# Patient Record
Sex: Female | Born: 1980 | Race: White | Hispanic: No | Marital: Married | Smoking: Current every day smoker
Health system: Southern US, Community
[De-identification: ages and names within clinical notes are randomized; demographics above are authoritative.]

## PROBLEM LIST (undated history)

## (undated) DIAGNOSIS — K589 Irritable bowel syndrome without diarrhea: Secondary | ICD-10-CM

## (undated) DIAGNOSIS — U099 Post covid-19 condition, unspecified: Secondary | ICD-10-CM

## (undated) DIAGNOSIS — F4310 Post-traumatic stress disorder, unspecified: Secondary | ICD-10-CM

## (undated) DIAGNOSIS — G2581 Restless legs syndrome: Secondary | ICD-10-CM

## (undated) DIAGNOSIS — R5383 Other fatigue: Secondary | ICD-10-CM

## (undated) DIAGNOSIS — F32A Depression, unspecified: Secondary | ICD-10-CM

## (undated) DIAGNOSIS — K219 Gastro-esophageal reflux disease without esophagitis: Secondary | ICD-10-CM

## (undated) DIAGNOSIS — F329 Major depressive disorder, single episode, unspecified: Secondary | ICD-10-CM

## (undated) DIAGNOSIS — F419 Anxiety disorder, unspecified: Secondary | ICD-10-CM

## (undated) DIAGNOSIS — G43909 Migraine, unspecified, not intractable, without status migrainosus: Secondary | ICD-10-CM

## (undated) DIAGNOSIS — G4700 Insomnia, unspecified: Secondary | ICD-10-CM

## (undated) HISTORY — DX: Insomnia, unspecified: G47.00

## (undated) HISTORY — DX: Post-traumatic stress disorder, unspecified: F43.10

## (undated) HISTORY — PX: OTHER SURGICAL HISTORY: SHX169

## (undated) HISTORY — DX: Migraine, unspecified, not intractable, without status migrainosus: G43.909

## (undated) HISTORY — PX: CHOLECYSTECTOMY, LAPAROSCOPIC: SHX56

## (undated) HISTORY — DX: Irritable bowel syndrome, unspecified: K58.9

## (undated) HISTORY — DX: Post covid-19 condition, unspecified: U09.9

## (undated) HISTORY — DX: Other fatigue: R53.83

## (undated) HISTORY — DX: Anxiety disorder, unspecified: F41.9

## (undated) HISTORY — DX: Gastro-esophageal reflux disease without esophagitis: K21.9

## (undated) HISTORY — DX: Restless legs syndrome: G25.81

---

## 2011-05-06 ENCOUNTER — Inpatient Hospital Stay (INDEPENDENT_AMBULATORY_CARE_PROVIDER_SITE_OTHER)
Admission: RE | Admit: 2011-05-06 | Discharge: 2011-05-06 | Disposition: A | Discharge status: Home / Self Care | Payer: Managed Care, Other (non HMO) | Source: Ambulatory Visit | Attending: Emergency Medicine | Admitting: Emergency Medicine

## 2011-05-06 ENCOUNTER — Encounter: Payer: Self-pay | Admitting: Emergency Medicine

## 2011-05-06 DIAGNOSIS — J020 Streptococcal pharyngitis: Secondary | ICD-10-CM

## 2011-05-06 LAB — CONVERTED CEMR LAB: Rapid Strep: NEGATIVE

## 2011-06-24 NOTE — Progress Notes (Signed)
Summary: POSS STREP THROAT...WSE rm 4   Vital Signs:  Patient Profile:   30 Years Old Female CC:      sore throat, fever, and white spots on throat x 2 days Height:     64 inches Weight:      161.25 pounds O2 Sat:      100 % O2 treatment:    Room Air Temp:     98.5 degrees F oral Pulse rate:   87 / minute Resp:     16 per minute BP sitting:   119 / 76  (left arm) Cuff size:   regular  Vitals Entered By: Clemens Catholic LPN (May 06, 2011 5:54 PM)                  Updated Prior Medication List: CITALOPRAM HYDROBROMIDE 20 MG TABS (CITALOPRAM HYDROBROMIDE)   Current Allergies: ! MACROBID ! SULFAHistory of Present Illness Chief Complaint: sore throat, fever, and white spots on throat x 2 days History of Present Illness: 30 Years Old Female complains of onset of cold symptoms for a few days.   + sore throat and white spots No cough No pleuritic pain No wheezing No nasal congestion No post-nasal drainage No sinus pain/pressure No chest congestion No itchy/red eyes No earache No hemoptysis No SOB No chills/sweats + fever No nausea No vomiting No abdominal pain No diarrhea No skin rashes No fatigue No myalgias No headache   REVIEW OF SYSTEMS Constitutional Symptoms       Complains of fever, chills, and night sweats.     Denies weight loss, weight gain, and fatigue.  Eyes       Denies change in vision, eye pain, eye discharge, glasses, contact lenses, and eye surgery. Ear/Nose/Throat/Mouth       Complains of frequent runny nose, sinus problems, and sore throat.      Denies hearing loss/aids, change in hearing, ear pain, ear discharge, dizziness, frequent nose bleeds, hoarseness, and tooth pain or bleeding.  Respiratory       Complains of dry cough.      Denies productive cough, wheezing, shortness of breath, asthma, bronchitis, and emphysema/COPD.  Cardiovascular       Denies murmurs, chest pain, and tires easily with exhertion.    Gastrointestinal  Denies stomach pain, nausea/vomiting, diarrhea, constipation, blood in bowel movements, and indigestion. Genitourniary       Denies painful urination, kidney stones, and loss of urinary control. Neurological       Denies paralysis, seizures, and fainting/blackouts. Musculoskeletal       Denies muscle pain, joint pain, joint stiffness, decreased range of motion, redness, swelling, muscle weakness, and gout.  Skin       Denies bruising, unusual mles/lumps or sores, and hair/skin or nail changes.  Psych       Denies mood changes, temper/anger issues, anxiety/stress, speech problems, depression, and sleep problems. Other Comments: pt c/o sore throat, fever, and white spots on throat x 2 days.    Past History:  Past Medical History: Unremarkable  Past Surgical History: double fisulaplasty  Family History: father-[place]  Social History: Current Smoker 1/2 PPD Alcohol use-no  Drug use-no Smoking Status:  current Drug Use:  no Physical Exam General appearance: well developed, well nourished, no acute distress Ears: normal, no lesions or deformities Nasal: mucosa pink, nonedematous, no septal deviation, turbinates normal Oral/Pharynx: pharyngeal erythema with exudate, uvula midline without deviation Neck: ant cerv LAD Chest/Lungs: no rales, wheezes, or rhonchi bilateral, breath sounds equal without  effort Heart: regular rate and  rhythm, no murmur MSE: oriented to time, place, and person Assessment New Problems: STREPTOCOCCAL SORE THROAT (ICD-034.0)   Plan New Orders: New Patient Level II [99202] Bicillin CR 1.2 million units Injection [J0558] Admin of Therapeutic Inj  intramuscular or subcutaneous [96372] Planning Comments:   1)  Rapid strep negative, however with her physical exam I believe that she requires treatment.  Bicillin given today.  Change toothbrush in 1-2 days.  No sharing cups/utensils.  If not improving, consider pred and checking Monospot. 2)  Use nasal saline  solution (over the counter) at least 3 times a day. 3)  Use over the counter decongestants like Zyrtec-D every 12 hours as needed to help with congestion. 4)  Can take tylenol every 6 hours or motrin every 8 hours for pain or fever. 5)  Follow up with your primary doctor  if no improvement in 5-7 days, sooner if increasing pain, fever, or new symptoms.    The patient and/or caregiver has been counseled thoroughly with regard to medications prescribed including dosage, schedule, interactions, rationale for use, and possible side effects and they verbalize understanding.  Diagnoses and expected course of recovery discussed and will return if not improved as expected or if the condition worsens. Patient and/or caregiver verbalized understanding.   Medication Administration  Injection # 1:    Medication: Bicillin CR 1.2 million units Injection    Diagnosis: STREPTOCOCCAL SORE THROAT (ICD-034.0)    Route: IM    Site: LUOQ gluteus    Exp Date: 06/21/2012    Lot #: [id number]    Mfr: [facility].    Patient tolerated injection without complications    Given by: Clemens Catholic LPN (May 06, 2011 6:10 PM)  Orders Added: 1)  New Patient Level II [99202] 2)  Bicillin CR 1.2 million units Injection [J0558] 3)  Admin of Therapeutic Inj  intramuscular or subcutaneous [96372]    Laboratory Results  Date/Time Received: May 06, 2011 5:58 PM  Date/Time Reported: May 06, 2011 5:58 PM   Other Tests  Rapid Strep: negative  Kit Test Internal QC: Negative   (Normal Range: Negative)

## 2011-10-13 ENCOUNTER — Emergency Department
Admission: EM | Admit: 2011-10-13 | Discharge: 2011-10-13 | Disposition: A | Discharge status: Home / Self Care | Payer: Managed Care, Other (non HMO) | Source: Home / Self Care | Attending: Emergency Medicine | Admitting: Emergency Medicine

## 2011-10-13 DIAGNOSIS — J069 Acute upper respiratory infection, unspecified: Secondary | ICD-10-CM

## 2011-10-13 DIAGNOSIS — J029 Acute pharyngitis, unspecified: Secondary | ICD-10-CM

## 2011-10-13 HISTORY — DX: Major depressive disorder, single episode, unspecified: F32.9

## 2011-10-13 HISTORY — DX: Depression, unspecified: F32.A

## 2011-10-13 MED ORDER — PENICILLIN G BENZATHINE 1200000 UNIT/2ML IM SUSP
1.2000 10*6.[IU] | Freq: Once | INTRAMUSCULAR | Status: AC
  Administered 2011-10-13: 1.2 10*6.[IU] via INTRAMUSCULAR

## 2011-10-13 NOTE — ED Notes (Signed)
Sore throat started yesterday, T-Max 102.0 this am has taken advil

## 2011-10-13 NOTE — ED Provider Notes (Signed)
History     CSN: [id number]  Arrival date & time 10/13/11  1132   First MD Initiated Contact with Patient 10/13/11 1134      Chief Complaint  Patient presents with  . Sore Throat    (Consider location/radiation/quality/duration/timing/severity/associated sxs/prior treatment) HPI Isabella Martin is a 31 y.o. female who complains of onset of cold symptoms for a few days.  The patient states it is constant moderate in severity.  She had strep throat 6 months ago successfully treated with Bicillin. +sore throat No cough No pleuritic pain No wheezing No nasal congestion No post-nasal drainage No sinus pain/pressure No chest congestion No itchy/red eyes No earache No hemoptysis No SOB + chills/sweats + fever No nausea No vomiting No abdominal pain No diarrhea No skin rashes No fatigue No myalgias No headache    No past medical history on file.  No past surgical history on file.  No family history on file.  History  Substance Use Topics  . Smoking status: Not on file  . Smokeless tobacco: Not on file  . Alcohol Use: Not on file    OB History    No data available      Review of Systems  All other systems reviewed and are negative.    Allergies  Nitrofurantoin and Sulfonamide derivatives  Home Medications  No current outpatient prescriptions on file.  BP 115/77  Pulse 104  Temp(Src) 99.3 F (37.4 C) (Oral)  Resp 20  Ht 5\' 4"  (1.626 m)  Wt 163 lb 8 oz (74.163 kg)  BMI 28.06 kg/m2  SpO2 96%  Physical Exam  Nursing note and vitals reviewed. Constitutional: She is oriented to person, place, and time. She appears well-developed and well-nourished.  HENT:  Head: Normocephalic and atraumatic.  Right Ear: Tympanic membrane, external ear and ear canal normal.  Left Ear: Tympanic membrane, external ear and ear canal normal.  Nose: Nose normal.  Mouth/Throat: Oropharyngeal exudate, posterior oropharyngeal edema (bilateral 1+ tonsillar swelling) and posterior  oropharyngeal erythema present.  Eyes: No scleral icterus.  Neck: Neck supple.  Cardiovascular: Regular rhythm and normal heart sounds.   Pulmonary/Chest: Effort normal and breath sounds normal. No respiratory distress.  Neurological: She is alert and oriented to person, place, and time.  Skin: Skin is warm and dry.  Psychiatric: She has a normal mood and affect. Her speech is normal.    ED Course  Procedures (including critical care time)  Labs Reviewed - No data to display No results found.   1. Acute pharyngitis   2. Acute upper respiratory infections of unspecified site       MDM  1)  Rapid strep positive.  Bicillin given.  Advised to change toothbrush. Work note given for tomorrow. 2)  Use nasal saline solution (over the counter) at least 3 times a day. 3)  Can take tylenol every 6 hours and/or motrin every 8 hours for pain or fever. 5)  Follow up with your primary doctor if no improvement in 5-7 days, sooner if increasing pain, fever, or new symptoms.  If becoming recurrent, consider follow up with ENT.   Marlaine Hind, MD 10/13/11 1153

## 2012-10-28 ENCOUNTER — Encounter: Payer: Self-pay | Admitting: Emergency Medicine

## 2012-10-28 ENCOUNTER — Emergency Department
Admission: EM | Admit: 2012-10-28 | Discharge: 2012-10-28 | Disposition: A | Discharge status: Home / Self Care | Payer: Managed Care, Other (non HMO) | Source: Home / Self Care | Attending: Family Medicine | Admitting: Family Medicine

## 2012-10-28 DIAGNOSIS — J029 Acute pharyngitis, unspecified: Secondary | ICD-10-CM

## 2012-10-28 LAB — POCT RAPID STREP A (OFFICE): Rapid Strep A Screen: POSITIVE — AB

## 2012-10-28 MED ORDER — PENICILLIN V POTASSIUM 500 MG PO TABS: 500.0000 mg | ORAL_TABLET | Freq: Three times a day (TID) | ORAL | Status: DC

## 2012-10-28 NOTE — ED Provider Notes (Signed)
History     CSN: [id number]  Arrival date & time 10/28/12  1055   First MD Initiated Contact with Patient 10/28/12 1110      Chief Complaint  Patient presents with  . Sore Throat   HPI  SORE THROAT  Onset: 2 days  Description: sore throat Modifying factors: hx/o recurrent strep   Symptoms  Fever:  mild URI symptoms: no Cough: no Headache: no Rash:  no Swollen glands:   yes Recent Strep Exposure: no LUQ pain: no Heartburn/brash: no Allergy Symptoms: no  Red Flags STD exposure: no Breathing difficulty: no Drooling: no Trismus: no   Past Medical History  Diagnosis Date  . Depression     Past Surgical History  Procedure Laterality Date  . Double syicter plasty      Family History  Problem Relation Age of Onset  . Cancer Father     History  Substance Use Topics  . Smoking status: Current Every Day Smoker  . Smokeless tobacco: Not on file  . Alcohol Use: No    OB History   Grav Para Term Preterm Abortions TAB SAB Ect Mult Living                  Review of Systems  All other systems reviewed and are negative.    Allergies  Nitrofurantoin and Sulfonamide derivatives  Home Medications   Current Outpatient Rx  Name  Route  Sig  Dispense  Refill  . citalopram (CELEXA) 10 MG tablet   Oral   Take 10 mg by mouth daily.           BP 111/76  Pulse 115  Temp(Src) 98.7 F (37.1 C) (Oral)  Resp 16  Ht 5\' 7"  (1.702 m)  Wt 157 lb (71.215 kg)  BMI 24.58 kg/m2  SpO2 99%  LMP 10/05/2012  Physical Exam  Constitutional: She appears well-developed and well-nourished.  HENT:  Head: Normocephalic and atraumatic.  Mouth/Throat: Oropharyngeal exudate present.  Eyes: Conjunctivae are normal. Pupils are equal, round, and reactive to light.  Neck: Normal range of motion. Neck supple.  Cardiovascular: Normal rate, regular rhythm and normal heart sounds.   Pulmonary/Chest: Effort normal.  Lymphadenopathy:    She has cervical adenopathy.   Neurological: She is alert.  Skin: Skin is warm.    ED Course  Procedures (including critical care time)  Labs Reviewed  POCT RAPID STREP A (OFFICE) - Abnormal; Notable for the following:    Rapid Strep A Screen Positive (*)    All other components within normal limits   No results found.   1. Acute pharyngitis       MDM  Will treat with pen vk x 10 days. Follow up with ENT as pt would benefit from tonsillectomy.  General and ENT red flags reviewed.  Followed up as needed.     The patient and/or caregiver has been counseled thoroughly with regard to treatment plan and/or medications prescribed including dosage, schedule, interactions, rationale for use, and possible side effects and they verbalize understanding. Diagnoses and expected course of recovery discussed and will return if not improved as expected or if the condition worsens. Patient and/or caregiver verbalized understanding.             Doree Albee, MD 10/28/12 1124

## 2012-10-28 NOTE — ED Notes (Signed)
Sore throat started yesterday

## 2016-03-26 ENCOUNTER — Emergency Department
Admission: EM | Admit: 2016-03-26 | Discharge: 2016-03-26 | Disposition: A | Discharge status: Home / Self Care | Payer: Managed Care, Other (non HMO) | Source: Home / Self Care | Attending: Family Medicine | Admitting: Family Medicine

## 2016-03-26 DIAGNOSIS — Z8719 Personal history of other diseases of the digestive system: Secondary | ICD-10-CM

## 2016-03-26 DIAGNOSIS — K2970 Gastritis, unspecified, without bleeding: Secondary | ICD-10-CM

## 2016-03-26 DIAGNOSIS — R1010 Upper abdominal pain, unspecified: Secondary | ICD-10-CM

## 2016-03-26 MED ORDER — OMEPRAZOLE 20 MG PO CPDR: 40.0000 mg | DELAYED_RELEASE_CAPSULE | Freq: Every day | ORAL | 0 refills | Status: DC

## 2016-03-26 NOTE — ED Notes (Signed)
97/61 BP entered in error

## 2016-03-26 NOTE — ED Provider Notes (Signed)
CSN: [id number]     Arrival date & time 03/26/16  1755 History   First MD Initiated Contact with Patient 03/26/16 1832     Chief Complaint  Patient presents with  . Abdominal Pain   (Consider location/radiation/quality/duration/timing/severity/associated sxs/prior Treatment) HPI  Isabella Martin is a 35 y.o. female presenting to UC with c/o gradually worsening upper abdominal pain that is aching and burning with associated bloated feeling for over 1 month.  Symptoms occur most often when eating any food or even drinking water. She has hx of GERD but states "that burning was in my throat" and she has not had prescription omeprazole in several years. She has tried OTC Pepto-bismol and Tums w/o relief.  Associated nausea but no vomiting.  She cannot f/u with her PCP for at least 5 weeks. She is requesting a referral to GI.   Past Medical History:  Diagnosis Date  . Depression    Past Surgical History:  Procedure Laterality Date  . Double Hotel manager     Family History  Problem Relation Age of Onset  . Cancer Father    Social History  Substance Use Topics  . Smoking status: Current Every Day Smoker  . Smokeless tobacco: Not on file  . Alcohol use No   OB History    No data available     Review of Systems  Constitutional: Negative for appetite change, chills and fever.  Respiratory: Negative for cough and shortness of breath.   Cardiovascular: Negative for chest pain and palpitations.  Gastrointestinal: Positive for abdominal pain and nausea. Negative for diarrhea and vomiting.  Genitourinary: Negative for dysuria, frequency and hematuria.    Allergies  Nitrofurantoin and Sulfonamide derivatives  Home Medications   Prior to Admission medications   Medication Sig Start Date End Date Taking? Authorizing Provider  omeprazole (PRILOSEC) 20 MG capsule Take 2 capsules (40 mg total) by mouth daily. For 4 weeks 03/26/16   Junius Finner, PA-C  penicillin v potassium (VEETID) 500 MG  tablet Take 1 tablet (500 mg total) by mouth 3 (three) times daily. 10/28/12   Floydene Flock, MD   Meds Ordered and Administered this Visit  Medications - No data to display  BP 97/61   Pulse 72   Temp 98 F (36.7 C) (Oral)   Ht 5\' 4"  (1.626 m)   Wt 169 lb 6.4 oz (76.8 kg)   LMP 02/26/2016   SpO2 100%   BMI 29.08 kg/m  No data found.   Physical Exam  Constitutional: She appears well-developed and well-nourished. No distress.  HENT:  Head: Normocephalic and atraumatic.  Mouth/Throat: Oropharynx is clear and moist.  Eyes: Conjunctivae are normal. No scleral icterus.  Neck: Normal range of motion.  Cardiovascular: Normal rate, regular rhythm and normal heart sounds.   Pulmonary/Chest: Effort normal and breath sounds normal. No respiratory distress. She has no wheezes. She has no rales.  Abdominal: Soft. She exhibits no distension and no mass. There is tenderness. There is no rebound and no guarding.  Soft, non-distended. Tenderness to upper abdomen w/o rebound or guarding.   Musculoskeletal: Normal range of motion.  Neurological: She is alert.  Skin: Skin is warm and dry. She is not diaphoretic.  Nursing note and vitals reviewed.   Urgent Care Course   Clinical Course    Procedures (including critical care time)  Labs Review Labs Reviewed - No data to display  Imaging Review No results found.   MDM   1. Gastritis   2.  Upper abdominal pain   3. History of gastroesophageal reflux (GERD)    Pt with hx of GERD c/o LUQ abdominal pain that is worse when eating, associated nausea.    Pt appears well non-toxic. Hx and exam c/w gastritis, pt may have gastric ulcer. Advised referrals to GI are not made by UC, however, offered a resource guide and encouraged pt to call GI office as they may not require a referral.  Also provided resources for other PCPs if she needs to be seen sooner than 5 weeks.  Rx: omeprazole 40mg  for 4 weeks.  Home care instructions with diet  provided. Patient verbalized understanding and agreement with treatment plan.     Junius FinnerErin O'Malley, PA-C 03/27/16 [phone]

## 2016-03-26 NOTE — ED Triage Notes (Signed)
Pt stated that this issue has been going on for over a month.  Has has nausea, stomach aches, diarrhea, gas and bloating for over a month.  She states that it happens with any food, and no medication helps relieve the pain.

## 2018-04-27 ENCOUNTER — Other Ambulatory Visit: Payer: Self-pay

## 2018-04-27 MED ORDER — DULOXETINE HCL 60 MG PO CPEP: 60.0000 mg | ORAL_CAPSULE | ORAL | 0 refills | Status: DC

## 2018-05-01 ENCOUNTER — Ambulatory Visit: Payer: BLUE CROSS/BLUE SHIELD | Admitting: Psychiatry

## 2018-05-01 DIAGNOSIS — F4310 Post-traumatic stress disorder, unspecified: Secondary | ICD-10-CM | POA: Diagnosis not present

## 2018-05-01 NOTE — Progress Notes (Signed)
      [facility] Counselor/Therapist Progress Note   Patient ID: Isabella Martin, MRN: [id number]  Date: 05/01/2018  Timespent: 60 minutes  Treatment Type: Individual   Subjective: Patient in today with some depression and anxiety (although not quite as strong "since I'm going on vacation tomorrow).  Discussed some painful parts of her past and how she is affected now.  Feels that it's her self-esteem and her thoughts that are most affected by her past and that these are the things she really needs to work on.  Strategies worked on to improve self-esteem, negative self-talk, dealing with old memories, excessive people-pleasing, not setting healthy boundaries, overthinking, feeling responsible for others' happiness, sense of shame or not good enough, and excessive worrying and looking for what may go wrong versus right.  Patient very engaged in working on strategies and reports feeling more hopeful for changes she wants to make.  Interventions:CBT, Solution Focused, Strength-based, Supportive and Reframing  Mental Status Exam:   Appearance:   Neat     Behavior:  Appropriate and Sharing  Motor:  Normal  Speech/Language:   Normal Rate  Affect:  Congruent  Mood:  anxious and depressed  Thought process:  Coherent  Thought content:    Logical  Perceptual disturbances:    Normal  Orientation:  Full (Time, Place, and Person)  Attention:  Good  Concentration:  good  Memory:  Immediate  Fund of knowledge:   Good  Insight:    Good  Judgment:   Good  Impulse Control:  good    Reported Symptoms: some anxiety and depression---not as strong in the moment "as I'm fixing to be off on vacation and am looking forward to that".   Risk Assessment: Danger to Self:  No Self-injurious Behavior: No Danger to Others: No Duty to Warn:no Physical Aggression / Violence:No  Access to Firearms a concern: No  Gang Involvement:No   Diagnosis:   ICD-10-CM   1. Posttraumatic stress disorder F43.10       Plan: Will see patient again in 2 weeks to continue goal-directed treatment.    Mathis Fare, LCSW

## 2018-05-18 ENCOUNTER — Encounter: Payer: Self-pay | Admitting: Emergency Medicine

## 2018-05-18 DIAGNOSIS — G4700 Insomnia, unspecified: Secondary | ICD-10-CM

## 2018-05-18 DIAGNOSIS — F329 Major depressive disorder, single episode, unspecified: Secondary | ICD-10-CM | POA: Insufficient documentation

## 2018-05-18 DIAGNOSIS — F411 Generalized anxiety disorder: Secondary | ICD-10-CM

## 2018-05-20 ENCOUNTER — Ambulatory Visit: Payer: BLUE CROSS/BLUE SHIELD | Admitting: Psychiatry

## 2018-05-20 DIAGNOSIS — F4310 Post-traumatic stress disorder, unspecified: Secondary | ICD-10-CM

## 2018-05-20 NOTE — Progress Notes (Signed)
[facility] Counselor/Therapist Progress Note   Patient ID: Isabella Martin, MRN: [id number]  Date: 05/20/2018  Timespent: 60 minutes  Treatment Type: Individual   Reported Symptoms: just feel I'm existing, anxiety, depressed and feeling blue--it comes and goes, some days I'm great and next day I'm not good, lack of motivation)     Mental Status Exam:    Appearance:   Neat     Behavior:  Appropriate and Sharing  Motor:  Normal  Speech/Language:   Clear and Coherent  Affect:  Flat  Mood:  anxious and depressed  Thought process:  normal  Thought content:    WNL  Sensory/Perceptual disturbances:    WNL  Orientation:  oriented to person, place, time/date, situation, day of week, month of year and year  Attention:  Good  Concentration:  Good  Memory:  WNL  Fund of knowledge:   Good  Insight:    Good  Judgment:   Good  Impulse Control:  Good     Risk Assessment: Danger to Self:  No Self-injurious Behavior: No Danger to Others: No Duty to Warn:no Physical Aggression / Violence:No  Access to Firearms a concern: No  Gang Involvement:No    Subjective: Patient in today reporting depression and anxiety, feeling "blue" and not feeling any happiness, low motivation.  Did have a good trip last week with friends and did experience "joy" during those few days "although I still had to force myself to get up and be involved.  Questions if she needs a stimulant to "help me feel more alive?"  Was reportedly "scattered" at school over the years.  Does have some attention and focus issues at work, tending to jump from one incomplete task to another. States husband has notice patient trying to be more positive, especially in her self-talk.  "I feel that I ma trying to be more self-accepting and self-assured, and not beating myself up over things I can't control".    Noticed that patient rarely smiles and affect is rather consistently flat, so asked her about this.  She then unloaded about how  impactful her dad's cancer dx was in Dec 03, 1998, and found it very difficult to deal with, but never got help except PCP put her on antidepressants.  Felt like the lexapro helped some but "I still was very depressed about my dad".  Dad died in 03-Dec-2007.  "I held my emotions in as I didn't want to harm my unborn baby at that time."  I tried to be "ok" but it has been very hard.  Talked at length about this today, opening up a lot more, and she seemed comfortable with this. "Previously I've felt very alone with this because of not really having someone there for me."  "I don't want the past to affect me so much, I want to be happy, mom is still negative some and is sort of selfish and doesn't act like I'm important to her."  I also need to not let negative people in my life have so much power as to how I think or feel."  Really did a good job in talking things through more today.  Actually smiled a couple times closer to end of session. Talked about continuing therapy and what our focus needs to be in following up on discussion today in order to help patient really move forward.  Reports feeling heard and more motivated at this point.  Will see patent within 2 weeks.   Interventions: Solution-Oriented/Positive Psychology, Ego-Supportive  and Insight-Oriented   Diagnosis:   ICD-10-CM   1. Posttraumatic stress disorder F43.10      Plan:  Patient to return in approx 2 weeks to continue goal-directed treatment.   Mathis Fare, LCSW

## 2018-05-21 ENCOUNTER — Other Ambulatory Visit: Payer: Self-pay | Admitting: Physician Assistant

## 2018-05-21 MED ORDER — ESZOPICLONE 3 MG PO TABS: 3.0000 mg | ORAL_TABLET | Freq: Every day | ORAL | 0 refills | Status: DC

## 2018-06-01 ENCOUNTER — Ambulatory Visit: Payer: BLUE CROSS/BLUE SHIELD | Admitting: Physician Assistant

## 2018-06-01 ENCOUNTER — Encounter: Payer: Self-pay | Admitting: Physician Assistant

## 2018-06-01 DIAGNOSIS — G2581 Restless legs syndrome: Secondary | ICD-10-CM | POA: Diagnosis not present

## 2018-06-01 DIAGNOSIS — F411 Generalized anxiety disorder: Secondary | ICD-10-CM

## 2018-06-01 DIAGNOSIS — F331 Major depressive disorder, recurrent, moderate: Secondary | ICD-10-CM

## 2018-06-01 MED ORDER — ALPRAZOLAM 1 MG PO TABS: ORAL_TABLET | ORAL | 2 refills | Status: DC

## 2018-06-01 MED ORDER — ATOMOXETINE HCL 40 MG PO CAPS: 40.0000 mg | ORAL_CAPSULE | Freq: Every day | ORAL | 1 refills | Status: DC

## 2018-06-01 MED ORDER — ZOLPIDEM TARTRATE 10 MG PO TABS: 10.0000 mg | ORAL_TABLET | Freq: Every evening | ORAL | 2 refills | Status: DC | PRN

## 2018-06-01 NOTE — Progress Notes (Signed)
[facility]  Patient ID: Isabella Martin,  MRN: [id number]  PCP: System, Provider Not In  Date of Evaluation: 06/01/2018 Time spent:15 minutes  Chief Complaint:  Chief Complaint    Follow-up      HISTORY/CURRENT STATUS: HPI For routine f/u.   We had started Lunesta at the [facility]. It's caused a H/A everytime she's taken it.  Also it causes a horrible taste in her mouth.  Prefers to go back to Ambien.  At least  She gets some sleep with that.  It does not always keep her sleep but it helped a lot more than the Lunesta or anything else has.  Patient denies loss of interest in usual activities and is able to enjoy things.  Denies decreased energy or motivation.  Appetite has not changed.  No extreme sadness, tearfulness, or feelings of hopelessness.  Denies any changes in concentration, making decisions or remembering things.  Denies suicidal or homicidal thoughts.  She feels that the Cymbalta is working really well.    Restless leg symptoms are well controlled as long as she takes the Xanax while before bed.    Individual Medical History/ Review of Systems: Changes? :Yes not able to focus well.  Not motivated to do things like wants to.  Has always kind of been this way but would like it to be better if possible.  Never dx w/ ADD/ADHD. Took a test online and it was positive for ADD sx.   Allergies: Nitrofurantoin; Sulfonamide derivatives; and Wellbutrin [bupropion]  Current Medications:  Current Outpatient Medications:  .  ALPRAZolam (XANAX) 1 MG tablet, 1/2-1 po q8hr prn anxiety and restless leg syndrome.  Max 1.5 po qd., Disp: 45 tablet, Rfl: 2 .  DULoxetine (CYMBALTA) 60 MG capsule, Take 1 capsule (60 mg total) by mouth every morning., Disp: 90 capsule, Rfl: 0 .  omeprazole (PRILOSEC) 20 MG capsule, Take 2 capsules (40 mg total) by mouth daily. For 4 weeks, Disp: 60 capsule, Rfl: 0 .  atomoxetine (STRATTERA) 40 MG capsule, Take 1 capsule (40 mg total) by mouth daily., Disp: 30  capsule, Rfl: 1 .  penicillin v potassium (VEETID) 500 MG tablet, Take 1 tablet (500 mg total) by mouth 3 (three) times daily. (Patient not taking: Reported on 06/01/2018), Disp: 30 tablet, Rfl: 0 .  sertraline (ZOLOFT) 100 MG tablet, Take 100 mg by mouth daily., Disp: , Rfl:  .  traZODone (DESYREL) 50 MG tablet, Take 50 mg by mouth at bedtime as needed for sleep. 1/2 - 2 qhs prn, Disp: , Rfl:  .  zaleplon (SONATA) 10 MG capsule, Take 10 mg by mouth. 1 po qhs prn sleep and may repeat, Disp: , Rfl:  .  zolpidem (AMBIEN) 10 MG tablet, Take 1 tablet (10 mg total) by mouth at bedtime as needed for sleep., Disp: 30 tablet, Rfl: 2 Medication Side Effects: headache from [facility] Medical/ Social History: Changes? No  MENTAL HEALTH EXAM:  There were no vitals taken for this visit.There is no height or weight on file to calculate BMI.  General Appearance: Well Groomed and Obese  Eye Contact:  Good  Speech:  Clear and Coherent  Volume:  Normal  Mood:  Euthymic  Affect:  Appropriate  Thought Process:  Goal Directed  Orientation:  Full (Time, Place, and Person)  Thought Content: Logical   Suicidal Thoughts:  No  Homicidal Thoughts:  No  Memory:  WNL  Judgement:  Good  Insight:  Good  Psychomotor Activity:  Normal  Concentration:  Concentration: Fair  Recall:  Good  Fund of Knowledge: Good  Language: Good  Assets:  Desire for Improvement  ADL's:  Intact  Cognition: WNL  Prognosis:  Good    DIAGNOSES:    ICD-10-CM   1. Generalized anxiety disorder F41.1   2. Major depressive disorder, recurrent episode, moderate (HCC) F33.1   3. Restless leg syndrome G25.81     Receiving Psychotherapy: Yes With Rockne Menghini, LCSW   RECOMMENDATIONS: We discussed different education options for ADD.  I am not sure if that is a separate diagnosis for her or if it is caused by the depression and anxiety.  At any rate, I do not want to use a stimulant because she takes Xanax and they just offset  each other.  The patient verbalizes understanding.  There are other treatments including Strattera, clonidine, guanfacine.  Her discussing each of these medications with side effects, benefits, risks, we agreed to start Strattera.  She accepts these benefits and risks. Continue Cymbalta and Xanax. Stop Lunesta. Restart Ambien 10 mg 1 nightly as needed sleep. Sleep hygiene was again discussed. Continue psychotherapy with Rockne Menghini, LCSW. Return in 4 to 6 weeks.   [name], PA-C

## 2018-06-08 ENCOUNTER — Ambulatory Visit: Payer: BLUE CROSS/BLUE SHIELD | Admitting: Psychiatry

## 2018-06-08 DIAGNOSIS — F411 Generalized anxiety disorder: Secondary | ICD-10-CM | POA: Diagnosis not present

## 2018-06-08 NOTE — Progress Notes (Signed)
      [facility] Counselor/Therapist Progress Note   Patient ID: Isabella Martin, MRN: [id number]  Date: 06/08/2018  Timespent: 60 munutes   Treatment Type: Individual   Reported Symptoms: anxiety, overwhelmed, fatigue   Mental Status Exam:    Appearance:   Casual     Behavior:  Appropriate and Sharing  Motor:  Normal  Speech/Language:   Normal Rate  Affect:  Congruent  Mood:  anxious and depressed  Thought process:  normal  Thought content:    WNL  Sensory/Perceptual disturbances:    WNL  Orientation:  oriented to person, place, time/date, situation, day of week, month of year and year  Attention:  Good  Concentration:  Fair  Memory:  WNL  Fund of knowledge:   Good  Insight:    Good  Judgment:   Good  Impulse Control:  Good     Risk Assessment: Danger to Self:  No Self-injurious Behavior: No Danger to Others: No Duty to Warn:no Physical Aggression / Violence:No  Access to Firearms a concern: No  Gang Involvement:No    Subjective: Patient in today reporting some anxiety and depression, and fatigue.  Trying to find a balance in her finances---did a debt consolidation plan last week to try and get out of debt in 4 yrs.  Pre-holiday stressors and husband not really helping out---"he doesn't offer".  Role-played with patient ways that she could speak up more and be comfortable asking for help as needed.  Went on to open-up on how she was feeling lonely in the marriage.  Discussed this and talked about communication strategies that could lead to their coming closer, including having a predictable "date night".  Talked about what might could help their marriage, as she admits that she thinks about leaving the marriage but doesn't really want to, however wants a husband versus room-mate.  Plans to talk with him soon.  Shared that they saw a marital therapist Tina Griffiths([facility] [place])  for about 5 yrs but did not really resolve any issues.  Seemed to be lack of motivation to make  and keep changes in their relationship.   Interventions: Solution-Oriented/Positive Psychology and Ego-Supportive   Diagnosis:   ICD-10-CM   1. Generalized anxiety disorder F41.1      Plan: Patient to follow through on recommendations/strategies discussed above in our session.  States she should have a good opportunity to talk with husband tomorrow evening.   Isabella Fareeborah Dosia Yodice, LCSW

## 2018-06-26 ENCOUNTER — Other Ambulatory Visit: Payer: Self-pay

## 2018-06-26 MED ORDER — ATOMOXETINE HCL 40 MG PO CAPS: 40.0000 mg | ORAL_CAPSULE | Freq: Every day | ORAL | 0 refills | Status: DC

## 2018-07-17 ENCOUNTER — Ambulatory Visit: Payer: BLUE CROSS/BLUE SHIELD | Admitting: Physician Assistant

## 2018-07-24 ENCOUNTER — Other Ambulatory Visit: Payer: Self-pay

## 2018-07-24 MED ORDER — DULOXETINE HCL 60 MG PO CPEP: 60.0000 mg | ORAL_CAPSULE | ORAL | 0 refills | Status: DC

## 2018-08-07 ENCOUNTER — Ambulatory Visit: Payer: BLUE CROSS/BLUE SHIELD | Admitting: Physician Assistant

## 2018-08-07 ENCOUNTER — Encounter: Payer: Self-pay | Admitting: Physician Assistant

## 2018-08-07 DIAGNOSIS — E559 Vitamin D deficiency, unspecified: Secondary | ICD-10-CM | POA: Diagnosis not present

## 2018-08-07 DIAGNOSIS — F32A Depression, unspecified: Secondary | ICD-10-CM

## 2018-08-07 DIAGNOSIS — F411 Generalized anxiety disorder: Secondary | ICD-10-CM | POA: Diagnosis not present

## 2018-08-07 DIAGNOSIS — R5383 Other fatigue: Secondary | ICD-10-CM

## 2018-08-07 DIAGNOSIS — G4700 Insomnia, unspecified: Secondary | ICD-10-CM

## 2018-08-07 DIAGNOSIS — F329 Major depressive disorder, single episode, unspecified: Secondary | ICD-10-CM

## 2018-08-07 MED ORDER — MIRTAZAPINE 7.5 MG PO TABS: 7.5000 mg | ORAL_TABLET | Freq: Every evening | ORAL | 1 refills | Status: DC | PRN

## 2018-08-07 NOTE — Progress Notes (Signed)
[facility]  Patient ID: Isabella Martin,  MRN: [id number]  PCP: System, Provider Not In  Date of Evaluation: 08/07/2018 Time spent:15 minutes  Chief Complaint:  Chief Complaint    Follow-up      HISTORY/CURRENT STATUS: HPI For routine med check.  Overall doing well.  But for the past few weeks, has had extreme exhaustion in the afternoon, starting about 2 weeks ago.  No changes have been made.  She still does not sleep very well, even with the Ambien but it helps more than anything else she is ever taken.  Denies fevers chills, night sweats weight loss or gain.  No recent infections.  No GI symptoms.  No GU symptoms.  She has had increased thirst since going on the Cymbalta and she does urinate a lot but that has been normal for her and she has not noticed any changes.  Patient denies loss of interest in usual activities and is able to enjoy things.  Denies decreased energy or motivation.  Appetite has not changed.  No extreme sadness, tearfulness, or feelings of hopelessness.  Denies any changes in concentration, making decisions or remembering things.  Denies suicidal or homicidal thoughts.  Anxiety is well controlled.  Work is going well.  Individual Medical History/ Review of Systems: Changes? :No    Past medications for mental health diagnoses include: Sonata, Zoloft, Lunesta, Lexapro, Celexa, Prozac, Wellbutrin, Ambien, Xanax, trazodone  Allergies: Nitrofurantoin; Sulfonamide derivatives; and Wellbutrin [bupropion]  Current Medications:  Current Outpatient Medications:  .  ALPRAZolam (XANAX) 1 MG tablet, 1/2-1 po q8hr prn anxiety and restless leg syndrome.  Max 1.5 po qd., Disp: 45 tablet, Rfl: 2 .  DULoxetine (CYMBALTA) 60 MG capsule, Take 1 capsule (60 mg total) by mouth every morning., Disp: 90 capsule, Rfl: 0 .  pantoprazole (PROTONIX) 40 MG tablet, Take by mouth., Disp: , Rfl:  .  atomoxetine (STRATTERA) 40 MG capsule, Take 1 capsule (40 mg total) by mouth daily.  (Patient not taking: Reported on 08/07/2018), Disp: 90 capsule, Rfl: 0 .  mirtazapine (REMERON) 7.5 MG tablet, Take 1-2 tablets (7.5-15 mg total) by mouth at bedtime as needed., Disp: 60 tablet, Rfl: 1 .  omeprazole (PRILOSEC) 20 MG capsule, Take 2 capsules (40 mg total) by mouth daily. For 4 weeks (Patient not taking: Reported on 08/07/2018), Disp: 60 capsule, Rfl: 0 .  penicillin v potassium (VEETID) 500 MG tablet, Take 1 tablet (500 mg total) by mouth 3 (three) times daily. (Patient not taking: Reported on 06/01/2018), Disp: 30 tablet, Rfl: 0 .  zolpidem (AMBIEN) 10 MG tablet, Take 1 tablet (10 mg total) by mouth at bedtime as needed for sleep., Disp: 30 tablet, Rfl: 2 Medication Side Effects: none  Family Medical/ Social History: Changes? No  MENTAL HEALTH EXAM:  There were no vitals taken for this visit.There is no height or weight on file to calculate BMI.  General Appearance: Casual  Eye Contact:  Good  Speech:  Clear and Coherent  Volume:  Normal  Mood:  Euthymic  Affect:  Appropriate  Thought Process:  Goal Directed  Orientation:  Full (Time, Place, and Person)  Thought Content: Logical   Suicidal Thoughts:  No  Homicidal Thoughts:  No  Memory:  WNL  Judgement:  Good  Insight:  Good  Psychomotor Activity:  Normal  Concentration:  Concentration: Good  Recall:  Good  Fund of Knowledge: Good  Language: Good  Assets:  Desire for Improvement  ADL's:  Intact  Cognition: WNL  Prognosis:  Good    DIAGNOSES:    ICD-10-CM   1. Insomnia, unspecified type G47.00   2. Fatigue due to depression F32.9 CBC with Differential/Platelet   R53.83 Comprehensive metabolic panel    TSH  3. Generalized anxiety disorder F41.1   4. Vitamin D deficiency E55.9 VITAMIN D 25 Hydroxy (Vit-D Deficiency, Fractures)    Receiving Psychotherapy: Yes With Rockne Menghini, LCSW  RECOMMENDATIONS: Will obtain labs as above. Start Remeron 7.5 mg 1-2 nightly as needed sleep. Continue Cymbalta 60 mg  daily. Continue Xanax 1 mg as directed. Continue Ambien 10 mg nightly as needed Continue psychotherapy with Rockne Menghini, LCSW. Return in 3 months.    [name], PA-C

## 2018-08-10 ENCOUNTER — Ambulatory Visit: Payer: BLUE CROSS/BLUE SHIELD | Admitting: Psychiatry

## 2018-08-10 DIAGNOSIS — F411 Generalized anxiety disorder: Secondary | ICD-10-CM | POA: Diagnosis not present

## 2018-08-10 NOTE — Progress Notes (Signed)
      [facility] Counselor/Therapist Progress Note  Patient ID: Isabella Martin, MRN: [id number],    Date: 08/10/2018  Time Spent: 58 minutes  Treatment Type: Individual Therapy  Reported Symptoms:   Anxiety, tired, frustrated,  Mental Status Exam:  Appearance:   Casual     Behavior:  Appropriate and Sharing  Motor:  Normal  Speech/Language:   Normal Rate  Affect:  Congruent  Mood:  anxious and irritable  Thought process:  normal  Thought content:    WNL  Sensory/Perceptual disturbances:    WNL  Orientation:  oriented to person, place, time/date, situation, day of week, month of year and year  Attention:  Good  Concentration:  Good  Memory:  WNL  Fund of knowledge:   Good  Insight:    Fair  Judgment:   Good  Impulse Control:  Good   Risk Assessment: Danger to Self:  No Self-injurious Behavior: No Danger to Others: No Duty to Warn:no Physical Aggression / Violence:No  Access to Firearms a concern: No  Gang Involvement:No   Subjective:  Patient in today reporting anxiety, irritability, tired, and frustrated.  Past 2 wks at work have been very stressful and have felt tired.  To have blood work done this week. Reports not feeling as tired today and actually got good rest this past weekend---feel better today than in a while. Work is very stressful and that continues. Husband and she are doing better--"he seems more aware of changes he needs to make versus me trying to change him".  Holidays were good and not very stressful; husband did help with that.  Husband actually helping some with chores and seems to pick up on when patient is stressed more.  Patient states she didn't realize how much difference good sleep can make.  Really made effort to get more sleep this weekend and others have noticed that she seems to feel better.  Smiling much more.  Going forward, looking at what she needs to do in terms of self-care to feel better. "I try not to worry too much and least not let it consume  me."  My mindset has changed some to the positive side which has helped me. I'm learning how to talk my self into realizing sometimes things aren't as bad as they seem.  Shared with her the CBT diagram and further discussed the importance of positive thoughts and how they lead to more positive feelings, resulted in improved/positive behaviors.  Interventions: Cognitive Behavioral Therapy and Ego-Supportive  Diagnosis:   ICD-10-CM   1. Generalized anxiety disorder F41.1     Plan: Patient will follow through on allowing herself to have better sleep, some "me" time more often, and try to maintain a more positive mindset.  Return in 3-4 wks.  Mathis Fareeborah Jacques Fife, LCSW

## 2018-08-26 ENCOUNTER — Other Ambulatory Visit: Payer: Self-pay | Admitting: Physician Assistant

## 2018-08-26 ENCOUNTER — Telehealth: Payer: Self-pay | Admitting: Physician Assistant

## 2018-08-26 NOTE — Telephone Encounter (Signed)
Please triage

## 2018-08-26 NOTE — Telephone Encounter (Signed)
Pt. Was wanting to know what her lab results were from Jan.24. They were not in the computer or her chart so I called [facility] and had them fax her results to [name]. Also, patient states that her husband accidentally dropped her new prescription of Xanax in the sink and they all went down the drain. She wants to know what can she do about that.

## 2018-08-26 NOTE — Telephone Encounter (Signed)
Patient called has questions regarding lab work please call at [phone]

## 2018-08-26 NOTE — Telephone Encounter (Signed)
Labs were all okay except Vitamin D is low.  Start Vitamin D 50,000 IU once a week for 12 weeks and then we'll repeat level.  Please send in RX for #12 with no RF.  On the Xanax 1mg , I'm ok to give her #30 pills, 1/2-1 bid prn. No RF even though this will be early.  This has never happened before and the [facility] looks ok.  Remind her to keep the Xanax in a safe place so something like this won't happen again.  I won't be able to fill early again. Thanks.

## 2018-08-27 ENCOUNTER — Other Ambulatory Visit: Payer: Self-pay

## 2018-08-27 MED ORDER — ALPRAZOLAM 1 MG PO TABS: ORAL_TABLET | ORAL | 0 refills | Status: DC

## 2018-08-27 MED ORDER — DULOXETINE HCL 60 MG PO CPEP: 60.0000 mg | ORAL_CAPSULE | ORAL | 0 refills | Status: DC

## 2018-08-27 MED ORDER — VITAMIN D (ERGOCALCIFEROL) 1.25 MG (50000 UNIT) PO CAPS: 50000.0000 [IU] | ORAL_CAPSULE | ORAL | 0 refills | Status: DC

## 2018-08-27 MED ORDER — ZOLPIDEM TARTRATE 10 MG PO TABS: 10.0000 mg | ORAL_TABLET | Freq: Every evening | ORAL | 2 refills | Status: DC | PRN

## 2018-08-27 NOTE — Telephone Encounter (Signed)
Pt aware of information, also requesting her refill for duloxetine and ambien refills be submitted as well.  Last refill ambien 08/02/2018 #30   Will submit/call in rx's to her pharmacy

## 2018-09-01 ENCOUNTER — Other Ambulatory Visit: Payer: Self-pay | Admitting: Physician Assistant

## 2018-09-08 ENCOUNTER — Ambulatory Visit: Payer: BLUE CROSS/BLUE SHIELD | Admitting: Psychiatry

## 2018-09-10 ENCOUNTER — Other Ambulatory Visit: Payer: Self-pay | Admitting: Physician Assistant

## 2018-09-15 ENCOUNTER — Other Ambulatory Visit: Payer: Self-pay | Admitting: Physician Assistant

## 2018-09-26 ENCOUNTER — Other Ambulatory Visit: Payer: Self-pay | Admitting: Physician Assistant

## 2018-10-07 ENCOUNTER — Other Ambulatory Visit: Payer: Self-pay

## 2018-10-07 ENCOUNTER — Ambulatory Visit: Payer: BLUE CROSS/BLUE SHIELD | Admitting: Psychiatry

## 2018-10-07 DIAGNOSIS — F411 Generalized anxiety disorder: Secondary | ICD-10-CM | POA: Diagnosis not present

## 2018-10-07 NOTE — Progress Notes (Signed)
      [facility] Counselor/Therapist Progress Note  Patient ID: Isabella Martin, MRN: [id number],    Date: 10/07/2018  Time Spent: 58 minutes  Treatment Type: Individual Therapy  Reported Symptoms:   Anxiety  Mental Status Exam:  Appearance:   Casual     Behavior:  Appropriate and Sharing  Motor:  Normal  Speech/Language:   Normal Rate  Affect:  Congruent  Mood:  Anxious   Thought process:  normal  Thought content:    WNL  Sensory/Perceptual disturbances:    WNL  Orientation:  oriented to person, place, time/date, situation, day of week, month of year and year  Attention:  Good  Concentration:  Good  Memory:  WNL  Fund of knowledge:   Good  Insight:    Fair  Judgment:   Good  Impulse Control:  Good   Risk Assessment: Danger to Self:  No Self-injurious Behavior: No Danger to Others: No Duty to Warn:no Physical Aggression / Violence:No  Access to Firearms a concern: No  Gang Involvement:No   Subjective:  Patient in today with symptoms noted above.  Noticeably smiling more and seeming to be in good spirits.  Things had been going better at home between she and husband.  "But he fell off the wagon"!  Discussed how she might approach this with husband in positive manner and patient was very receptive.   Work remains very stressful. Reports her mindset continues to be more positive but is having to work at it more.  Made progress in elevating her self care (physically and emotionally) and in trying to worry less, especially about things she cannot control.  Reviewed self-talk and she is showing progress in that area as well.  Has used CBT diagram as a guide in creating more positive thoughts and has seen how they lead to more positive feelings, and result in improved/more positive behaviors.  Interventions: Cognitive Behavioral Therapy and Ego-Supportive  Diagnosis:   ICD-10-CM   1. Generalized anxiety disorder F41.1     Plan: Patient will follow through on good communication  with husband as to how be can help her more, continuing better self-care and self-talk, having some "me" time more often, and try to maintain a more positive mindset.  Return in 3-4 wks.  Mathis Fare, LCSW

## 2018-10-09 DIAGNOSIS — K219 Gastro-esophageal reflux disease without esophagitis: Secondary | ICD-10-CM | POA: Insufficient documentation

## 2018-11-06 ENCOUNTER — Other Ambulatory Visit: Payer: Self-pay

## 2018-11-06 ENCOUNTER — Ambulatory Visit (INDEPENDENT_AMBULATORY_CARE_PROVIDER_SITE_OTHER): Payer: BLUE CROSS/BLUE SHIELD | Admitting: Physician Assistant

## 2018-11-06 ENCOUNTER — Encounter: Payer: Self-pay | Admitting: Physician Assistant

## 2018-11-06 DIAGNOSIS — G2581 Restless legs syndrome: Secondary | ICD-10-CM

## 2018-11-06 DIAGNOSIS — F331 Major depressive disorder, recurrent, moderate: Secondary | ICD-10-CM

## 2018-11-06 DIAGNOSIS — G4700 Insomnia, unspecified: Secondary | ICD-10-CM | POA: Diagnosis not present

## 2018-11-06 DIAGNOSIS — F411 Generalized anxiety disorder: Secondary | ICD-10-CM | POA: Diagnosis not present

## 2018-11-06 MED ORDER — DULOXETINE HCL 30 MG PO CPEP: 30.0000 mg | ORAL_CAPSULE | Freq: Every day | ORAL | 0 refills | Status: DC

## 2018-11-06 MED ORDER — ALPRAZOLAM 1 MG PO TABS: 1.0000 mg | ORAL_TABLET | Freq: Two times a day (BID) | ORAL | 1 refills | Status: DC | PRN

## 2018-11-06 MED ORDER — ZOLPIDEM TARTRATE 10 MG PO TABS: 10.0000 mg | ORAL_TABLET | Freq: Every evening | ORAL | 1 refills | Status: DC | PRN

## 2018-11-06 NOTE — Progress Notes (Signed)
[facility]  Patient ID: Isabella Martin,  MRN: [id number]  PCP: System, Provider Not In  Date of Evaluation: 11/06/2018 Time spent:15 minutes  Chief Complaint:  Chief Complaint    Follow-up     Virtual Visit via Telephone Note  I connected with Isabella Martin on 11/06/18 at  4:30 PM EDT by telephone and verified that I am speaking with the correct person using two identifiers.   I discussed the limitations, risks, security and privacy concerns of performing an evaluation and management service by telephone and the availability of in person appointments. I also discussed with the patient that there may be a patient responsible charge related to this service. The patient expressed understanding and agreed to proceed.    HISTORY/CURRENT STATUS: HPI for routine med check.  Patient states she is having a a lot more anxiety.  Most of it is caused by the situation with coronavirus.  She has trouble with racing thoughts, and in the evening she is having more trouble with the restless leg syndrome.  The Xanax does help but not completely.  She feels anxiety in general, and does occasionally have panic attacks.  The panic attacks will last approximately 30 minutes or so and then the physical symptoms of shortness of breath and palpitations will go away.  She rarely takes the Xanax during the daytime because she needs it at night for the restless leg syndrome.  She is also had a little bit more hopelessness, decreased energy and motivation.  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  Reports that is probably a big reason for her fatigue but nevertheless it still there.  She denies suicidal or homicidal thoughts.  She has not had much time to enjoy anything.  Denies muscle or joint pain, stiffness, or dystonia.  Denies dizziness, syncope, seizures, numbness, tingling,  tremor, tics, unsteady gait, slurred speech, confusion.   Individual Medical History/ Review of Systems: Changes? :No    Past medications for mental health diagnoses include: Sonata, Zoloft, Lunesta, Lexapro, Celexa, Prozac, Wellbutrin, Ambien, Xanax, trazodone  Allergies: Nitrofurantoin; Sulfonamide derivatives; and Wellbutrin [bupropion]  Current Medications:  Current Outpatient Medications:  .  ALPRAZolam (XANAX) 1 MG tablet, Take 1 tablet (1 mg total) by mouth 2 (two) times daily as needed for anxiety., Disp: 60 tablet, Rfl: 1 .  DULoxetine (CYMBALTA) 60 MG capsule, Take 1 capsule (60 mg total) by mouth every morning., Disp: 90 capsule, Rfl: 0 .  mirtazapine (REMERON) 7.5 MG tablet, TAKE 1-2 TABLETS (7.5-15 MG TOTAL) BY MOUTH AT BEDTIME AS NEEDED., Disp: 180 tablet, Rfl: 0 .  pantoprazole (PROTONIX) 40 MG tablet, Take by mouth daily as needed. , Disp: , Rfl:  .  Vitamin D, Ergocalciferol, (DRISDOL) 1.25 MG (50000 UT) CAPS capsule, Take 1 capsule (50,000 Units total) by mouth every 7 (seven) days. Take every 7 days for 12 weeks, then repeat labs, Disp: 12 capsule, Rfl: 0 .  zolpidem (AMBIEN) 10 MG tablet, Take 1 tablet (10 mg total) by mouth at bedtime as needed for sleep., Disp: 30 tablet, Rfl: 1 .  DULoxetine (CYMBALTA) 30 MG capsule, Take 1 capsule (30 mg total) by mouth daily. Add to 60 mg= 90 mg., Disp: 90 capsule, Rfl: 0 .  omeprazole (PRILOSEC) 20 MG capsule, Take 2 capsules (40 mg total) by mouth daily. For 4 weeks (Patient not taking: Reported on 08/07/2018),  Disp: 60 capsule, Rfl: 0 .  penicillin v potassium (VEETID) 500 MG tablet, Take 1 tablet (500 mg total) by mouth 3 (three) times daily. (Patient not taking: Reported on 06/01/2018), Disp: 30 tablet, Rfl: 0 Medication Side Effects: none  Family Medical/ Social History: Changes? 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   MENTAL HEALTH EXAM:  There  were no vitals taken for this visit.There is no height or weight on file to calculate BMI.  General Appearance: phone visit, unable to assess  Eye Contact:  unable to assess  Speech:  Clear and Coherent  Volume:  Normal  Mood:  Euthymic  Affect:  unable to assess  Thought Process:  Goal Directed  Orientation:  Full (Time, Place, and Person)  Thought Content: Logical   Suicidal Thoughts:  No  Homicidal Thoughts:  No  Memory:  WNL  Judgement:  Good  Insight:  Good  Psychomotor Activity:  unable to assess  Concentration:  Concentration: Good  Recall:  Good  Fund of Knowledge: Good  Language: Good  Assets:  Desire for Improvement  ADL's:  Intact  Cognition: WNL  Prognosis:  Good    DIAGNOSES:    ICD-10-CM   1. Generalized anxiety disorder F41.1   2. Major depressive disorder, recurrent episode, moderate (HCC) F33.1   3. Insomnia, unspecified type G47.00   4. Restless leg syndrome G25.81     Receiving Psychotherapy: Yes    RECOMMENDATIONS: Increase Xanax 1 mg 2 1 p.o. twice daily as needed. Increase Cymbalta to a total of 90 mg daily. Continue mirtazapine 7.5 mg 1-2 nightly as needed sleep.  Continue Ambien 10 mg nightly as needed. Continue psychotherapy with Rockne Menghini, LCSW. Return in 6 weeks.   [name], PA-C   This record has been created using [employer].  Chart creation errors have been sought, but may not always have been located and corrected. Such creation errors do not reflect on the standard of medical care.

## 2018-11-12 ENCOUNTER — Other Ambulatory Visit: Payer: Self-pay | Admitting: Physician Assistant

## 2018-11-12 NOTE — Telephone Encounter (Signed)
Need labs? Or continue

## 2018-11-19 ENCOUNTER — Other Ambulatory Visit: Payer: Self-pay

## 2018-11-19 ENCOUNTER — Ambulatory Visit (INDEPENDENT_AMBULATORY_CARE_PROVIDER_SITE_OTHER): Payer: BLUE CROSS/BLUE SHIELD | Admitting: Psychiatry

## 2018-11-19 DIAGNOSIS — F411 Generalized anxiety disorder: Secondary | ICD-10-CM

## 2018-11-19 NOTE — Progress Notes (Addendum)
[facility] Counselor/Therapist Progress Note  Patient ID: Isabella Martin, MRN: [id number],    Date: 11/19/2018  Time Spent: 57 minutes 12:05pm to 1:02pm  Virtual Visit Note : Connected with patient by a video enabled telemedicine/telehealth application or telephone, with their informed consent, and verified patient privacy and that I am speaking with the correct person using two identifiers. I discussed the limitations, risks, security and privacy concerns of performing psychotherapy and management service by telephone and the availability of in person appointments. I also discussed with the patient that there may be a patient responsible charge related to this service. The patient expressed understanding and agreed to proceed. I discussed the treatment planning with the patient. The patient was provided an opportunity to ask questions and all were answered. The patient agreed with the plan and demonstrated an understanding of the instructions. The patient was advised to call  our office if  symptoms worsen or feel they are in a crisis state and need immediate contact.  Therapist Location: [facility] Patient Location: home   Treatment Type: Individual Therapy  Reported Symptoms:   Anxiety (Increased)  Mental Status Exam:  Appearance:   Casual     Behavior:  Appropriate and Sharing  Motor:  Normal  Speech/Language:   Normal Rate  Affect:  Congruent  Mood:  Anxious   Thought process:  normal  Thought content:    WNL  Sensory/Perceptual disturbances:    WNL  Orientation:  oriented to person, place, time/date, situation, day of week, month of year and year  Attention:  Good  Concentration:  Good  Memory:  WNL  Fund of knowledge:   Good  Insight:    Fair  Judgment:   Good  Impulse Control:  Good   Risk Assessment: Danger to Self:  No Self-injurious Behavior: No Danger to Others: No Duty to Warn:no Physical Aggression / Violence:No  Access to Firearms a concern:  No  Gang Involvement:No   Subjective:  Patient reports today with continued anxiety related to coronavirus issues, husband being laid off, and financial stressors. Talked about each of these at length with the most critical for patient being the financial stressors.  Explained what she has been able to work out so far and is feeling a bit better.  Patient "feeling the anxiety alone as she says husband doesn't stress over it and he likes working 1 job instead of 2."  Talked about the emotional feeling attached to the financial stressors. Worked on strategies to help her in managing the increased anxiety and multiple stressors.  Even in the midst of very stressful times, patient has made some improvement, which we acknowledged in session.  Did seem more calm and a bit upbeat after talking today.    Patient encouraged to practice limit-setting as part of her overall self-care (physical and emotional).  To use strategies discussed today in anxiety management, including positive self-talk, recognizing the gains she has made, and the strength she is showing as she navigates all the changes and re-arranges created by coronavirus isssues.    Interventions: Cognitive Behavioral Therapy and Ego-Supportive  Diagnosis:   ICD-10-CM   1. Generalized anxiety disorder F41.1     Plan: Patient to use strategies mentioned above to help her with her anxiety that has increased recently.  She will continue to follow through on good communication with husband as to how be can help her more, continuing better self-care and self-talk, having some "me" time more often, and try to  maintain a more positive mindset.  Next appt in 1 month.  Isabella Fareeborah Keshonda Monsour, LCSW

## 2018-11-26 ENCOUNTER — Other Ambulatory Visit: Payer: Self-pay | Admitting: Physician Assistant

## 2018-12-18 ENCOUNTER — Ambulatory Visit: Payer: BLUE CROSS/BLUE SHIELD | Admitting: Psychiatry

## 2018-12-21 ENCOUNTER — Ambulatory Visit: Payer: BLUE CROSS/BLUE SHIELD | Admitting: Physician Assistant

## 2018-12-21 ENCOUNTER — Telehealth: Payer: Self-pay | Admitting: Physician Assistant

## 2018-12-22 ENCOUNTER — Other Ambulatory Visit: Payer: Self-pay | Admitting: Physician Assistant

## 2018-12-22 MED ORDER — ARIPIPRAZOLE 2 MG PO TABS: 2.0000 mg | ORAL_TABLET | ORAL | 1 refills | Status: DC

## 2018-12-22 NOTE — Telephone Encounter (Signed)
Pt apologizes she thought the visit yesterday was a telehealth not in person visit. She has been having increased symptoms of depression approx 2-3 weeks. C/o loneliness (her husband works a lot), sadness, wants to go out but she can't, feels overwhelmed, crying a lot, not feeling worthy, and has also gained weight within the last 2 months. Has been reading online and thinks maybe she needs an atypical and/or mood stabilizer. She would like to try a new one out since she's tried several older medications. Currently taking duloxetine 90 mg but doesn't feel like its helping. [facility]

## 2018-12-22 NOTE — Telephone Encounter (Signed)
You may have sent this already I'm not sure.

## 2018-12-22 NOTE — Telephone Encounter (Signed)
I doubt insurance will pay for a newer med.  I recommend adding Abilify 2 mg q am. #30.  I'll send it in, w/ other meds if she's willing to try.  If she fails this, then we should be able to give Rexulti and it will be more likely to be paid for.

## 2018-12-23 NOTE — Telephone Encounter (Signed)
Pt. Made aware and has already picked up the Abilify.

## 2019-01-04 DIAGNOSIS — E781 Pure hyperglyceridemia: Secondary | ICD-10-CM | POA: Insufficient documentation

## 2019-01-05 DIAGNOSIS — R7301 Impaired fasting glucose: Secondary | ICD-10-CM | POA: Insufficient documentation

## 2019-01-13 ENCOUNTER — Other Ambulatory Visit: Payer: Self-pay | Admitting: Physician Assistant

## 2019-01-15 ENCOUNTER — Telehealth: Payer: Self-pay | Admitting: Physician Assistant

## 2019-01-15 ENCOUNTER — Other Ambulatory Visit: Payer: Self-pay

## 2019-01-15 ENCOUNTER — Ambulatory Visit: Payer: Self-pay | Admitting: Physician Assistant

## 2019-01-15 MED ORDER — HYDROXYZINE HCL 25 MG PO TABS: 25.0000 mg | ORAL_TABLET | Freq: Three times a day (TID) | ORAL | 0 refills | Status: DC | PRN
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
## 2019-01-15 NOTE — Telephone Encounter (Signed)
Spoke with pt and she was still clearly upset about situation, she explained the incident. Reassured her and let her know what she's feeling is normal and it will get better as the days go on. She only takes the xanax at hs usually because it's pretty sedating for her, but occasionally does. Advised she can take 1/2 Xanax during the day to see if less sedating for her. Given information on hydroxyzine and this should be less sedating. She agreed. rx sent to her pharmacy. Instructed to call back with any questions or concerns.

## 2019-01-15 NOTE — Telephone Encounter (Signed)
Please call. Is she taking the Xanax?  It's 1 mg bid, so not low dose.  If not taking it, take it.  If she is, add hydroxyzine 25mg  tid prn.  Please send in Rx for #60, no RF.

## 2019-01-16 ENCOUNTER — Other Ambulatory Visit: Payer: Self-pay | Admitting: Physician Assistant

## 2019-01-30 ENCOUNTER — Other Ambulatory Visit: Payer: Self-pay | Admitting: Physician Assistant

## 2019-02-06 ENCOUNTER — Other Ambulatory Visit: Payer: Self-pay | Admitting: Physician Assistant

## 2019-02-17 ENCOUNTER — Ambulatory Visit: Payer: Self-pay | Admitting: Physician Assistant

## 2019-02-17 ENCOUNTER — Telehealth: Payer: Self-pay | Admitting: Physician Assistant

## 2019-02-17 ENCOUNTER — Other Ambulatory Visit: Payer: Self-pay | Admitting: Psychiatry

## 2019-02-17 NOTE — Telephone Encounter (Signed)
Patient need refill on Xanax, had to rs appt.,[facility] [address] in [place], patient also would like to take away the Ambien and add 1 mg, of the Xanax

## 2019-02-18 ENCOUNTER — Other Ambulatory Visit: Payer: Self-pay | Admitting: Psychiatry

## 2019-02-18 NOTE — Telephone Encounter (Signed)
Patient picked up Ambien on July 19. I would ask her to bring in the left of her Ambien so we could verify that she not overtaking it before increasing Xanax.  Xanax has a fairly short half-life and it may not work for sleep taking it twice daily.  However 1 mg is a pretty significant dose.  The patient could consider taking half milligram twice daily and 1 mg nightly.  That could improve her sleep.  If you do not have evidence of substance abuse you could consider 1 mg 3 times daily or perhaps as a compromise 0.5 mg 3 times daily and 1 mg nightly.  These are all options.

## 2019-02-18 NOTE — Telephone Encounter (Signed)
Isabella Martin, please call and clarify msg.  What does she mean 'take away the Ambien and add more Xanax?' She's already on pretty high dose of Xanax.  Traci and Dr. Loletha Grayer, I'll handle from here.  Thanks.

## 2019-02-18 NOTE — Telephone Encounter (Signed)
Pt. Stated that the Ambien gives her headaches so she does not want to take it anymore. She was wondering if she could go up to 2 Mg of the Xanax. It helps her sleep and helps with her restless legs. If not this option would you be able to maybe add 1 extra so she would take it TID instead of BID of the 1 Mg Xanax?

## 2019-02-19 NOTE — Telephone Encounter (Signed)
Ok thanks 

## 2019-02-19 NOTE — Telephone Encounter (Signed)
Isabella Martin, please ask her to bring in Ambien to the office, so we can dispose of it properly.  Then I'll increase the Xanax.  Thanks.

## 2019-02-19 NOTE — Telephone Encounter (Signed)
Pt. Is out of town so she will bring the Ambien in next week when she gets back.

## 2019-02-22 ENCOUNTER — Telehealth: Payer: Self-pay | Admitting: Physician Assistant

## 2019-02-22 ENCOUNTER — Other Ambulatory Visit: Payer: Self-pay | Admitting: Physician Assistant

## 2019-02-22 MED ORDER — ALPRAZOLAM 1 MG PO TABS: 1.0000 mg | ORAL_TABLET | Freq: Two times a day (BID) | ORAL | 2 refills | Status: DC | PRN

## 2019-02-22 NOTE — Telephone Encounter (Signed)
Pt called to request nurse to call back about  meds @ [phone]

## 2019-02-22 NOTE — Telephone Encounter (Signed)
[facility]

## 2019-02-22 NOTE — Telephone Encounter (Signed)
Pt. Called today and said she only has 12 Ambien left so she will just take them and Tylenol for the headache. Her Xanax  does need a refill. She stated she don't want to cause any issues with her medication so if you just wanted to prescribe it how it was until her Ambien runs out then that would be fine. Please advise. See previous note for more details.

## 2019-02-22 NOTE — Telephone Encounter (Signed)
Rx for Xanax sent in. 

## 2019-03-05 ENCOUNTER — Other Ambulatory Visit: Payer: Self-pay | Admitting: Physician Assistant

## 2019-03-07 NOTE — Telephone Encounter (Signed)
Has appt 09/03

## 2019-03-08 ENCOUNTER — Other Ambulatory Visit: Payer: Self-pay | Admitting: Physician Assistant

## 2019-03-25 ENCOUNTER — Ambulatory Visit: Payer: Self-pay | Admitting: Physician Assistant

## 2019-04-08 ENCOUNTER — Other Ambulatory Visit: Payer: Self-pay | Admitting: Physician Assistant

## 2019-04-15 ENCOUNTER — Other Ambulatory Visit: Payer: Self-pay | Admitting: Physician Assistant

## 2019-04-22 ENCOUNTER — Ambulatory Visit: Payer: BC Managed Care – PPO | Admitting: Physician Assistant

## 2019-04-23 ENCOUNTER — Other Ambulatory Visit: Payer: Self-pay

## 2019-04-23 ENCOUNTER — Ambulatory Visit (INDEPENDENT_AMBULATORY_CARE_PROVIDER_SITE_OTHER): Payer: BC Managed Care – PPO | Admitting: Physician Assistant

## 2019-04-23 ENCOUNTER — Encounter: Payer: Self-pay | Admitting: Physician Assistant

## 2019-04-23 DIAGNOSIS — G2581 Restless legs syndrome: Secondary | ICD-10-CM

## 2019-04-23 DIAGNOSIS — F411 Generalized anxiety disorder: Secondary | ICD-10-CM

## 2019-04-23 DIAGNOSIS — F4310 Post-traumatic stress disorder, unspecified: Secondary | ICD-10-CM | POA: Diagnosis not present

## 2019-04-23 DIAGNOSIS — G4700 Insomnia, unspecified: Secondary | ICD-10-CM

## 2019-04-23 DIAGNOSIS — F329 Major depressive disorder, single episode, unspecified: Secondary | ICD-10-CM

## 2019-04-23 DIAGNOSIS — F32A Depression, unspecified: Secondary | ICD-10-CM

## 2019-04-23 MED ORDER — HYDROXYZINE HCL 25 MG PO TABS: 25.0000 mg | ORAL_TABLET | Freq: Three times a day (TID) | ORAL | 1 refills | Status: DC | PRN

## 2019-04-23 MED ORDER — ALPRAZOLAM 1 MG PO TABS: 1.0000 mg | ORAL_TABLET | Freq: Three times a day (TID) | ORAL | 1 refills | Status: DC | PRN

## 2019-04-23 MED ORDER — TOPIRAMATE 25 MG PO TABS: ORAL_TABLET | ORAL | 1 refills | Status: DC

## 2019-04-23 NOTE — Progress Notes (Signed)
[facility] Med Check  Patient ID: Isabella Martin,  MRN: [id number]  PCP: System, Provider Not In  Date of Evaluation: 04/23/2019 Time spent:15 minutes  Chief Complaint:  Chief Complaint    Follow-up     Virtual Visit via Telephone Note  I connected with patient by a video enabled telemedicine application or telephone, with their informed consent, and verified patient privacy and that I am speaking with the correct person using two identifiers.  I am private, in my office and the patient is home.   I discussed the limitations, risks, security and privacy concerns of performing an evaluation and management service by telephone and the availability of in person appointments. I also discussed with the patient that there may be a patient responsible charge related to this service. The patient expressed understanding and agreed to proceed.   I discussed the assessment and treatment plan with the patient. The patient was provided an opportunity to ask questions and all were answered. The patient agreed with the plan and demonstrated an understanding of the instructions.   The patient was advised to call back or seek an in-person evaluation if the symptoms worsen or if the condition fails to improve as anticipated.  I provided 15 minutes of non-face-to-face time during this encounter.  HISTORY/CURRENT STATUS: HPI For routine f/u.  She gained a lot weight on the mirtazepine. Ambien gives her h/a and nightmares.  When she was on the higher dose of Cymbalta, she felt really tired and drugged.  She decreased back down to 60 mg.  So she has not done well with side effects of the medications.  She asked if there is something that will help her get the weight off.  She is exercising and eating very healthy but cannot seem to lose weight.  Her mood is good.  She is happy with the Cymbalta and Abilify.  She does not want to change those meds.  Her energy and motivation are good.  She is not  isolating.  She is working from home.  She denies suicidal or homicidal thoughts.  Patient denies increased energy with decreased need for sleep, no increased talkativeness, no racing thoughts, no impulsivity or risky behaviors, no increased spending, no increased libido, no grandiosity.  Complains of restless leg issues, starting in the late afternoon or early evening.  The Xanax does help it.  She feels like she does not respond to the Xanax as much as she used to though.  She has trouble going to sleep and staying asleep because her legs will jump if she does not take the Xanax 1 mg and now she is finding herself needing 1-1/2 or 2.  Denies dizziness, syncope, seizures, numbness, tingling, tremor, tics, unsteady gait, slurred speech, confusion. Denies muscle or joint pain, stiffness, or dystonia.  Individual Medical History/ Review of Systems: Changes? :No    Past medications for mental health diagnoses include: Sonata, Zoloft, Lunesta, Lexapro, Celexa, Prozac, Wellbutrin caused anger and irritability, Ambien, Xanax, trazodone  Allergies: Nitrofurantoin, Sulfonamide derivatives, and Wellbutrin [bupropion]  Current Medications:  Current Outpatient Medications:  .  ARIPiprazole (ABILIFY) 2 MG tablet, TAKE 1 TABLET BY MOUTH EVERY DAY IN THE MORNING, Disp: 90 tablet, Rfl: 0 .  DULoxetine (CYMBALTA) 60 MG capsule, TAKE 1 CAPSULE BY MOUTH EVERY DAY IN THE MORNING, Disp: 90 capsule, Rfl: 0 .  hydrOXYzine (ATARAX/VISTARIL) 25 MG tablet, Take 1 tablet (25 mg total) by mouth 3 (three) times daily as needed., Disp: 60 tablet, Rfl: 1 .  pantoprazole (PROTONIX) 40 MG tablet, Take by mouth daily as needed. , Disp: , Rfl:  .  Vitamin D, Ergocalciferol, (DRISDOL) 1.25 MG (50000 UT) CAPS capsule, TAKE 1 CAPSULE BY MOUTH EVERY 7 (SEVEN) DAYS 12 WEEKS, THEN REPEAT LABS, Disp: 12 capsule, Rfl: 0 .  ALPRAZolam (XANAX) 1 MG tablet, Take 1 tablet (1 mg total) by mouth 3 (three) times daily as needed for anxiety.  Max 2.5 per day for now., Disp: 75 tablet, Rfl: 1 .  omeprazole (PRILOSEC) 20 MG capsule, Take 2 capsules (40 mg total) by mouth daily. For 4 weeks (Patient not taking: Reported on 08/07/2018), Disp: 60 capsule, Rfl: 0 .  penicillin v potassium (VEETID) 500 MG tablet, Take 1 tablet (500 mg total) by mouth 3 (three) times daily. (Patient not taking: Reported on 06/01/2018), Disp: 30 tablet, Rfl: 0 .  topiramate (TOPAMAX) 25 MG tablet, 1 po qhs for 4 nights then increase to 2 po qhs., Disp: 60 tablet, Rfl: 1 .  zolpidem (AMBIEN) 10 MG tablet, TAKE 1 TABLET (10 MG TOTAL) BY MOUTH AT BEDTIME AS NEEDED FOR SLEEP. (Patient not taking: Reported on 04/23/2019), Disp: 30 tablet, Rfl: 1 Medication Side Effects: Mirtazapine caused weight gain, Ambien caused headaches and nightmares, Cymbalta at a higher dose caused grogginess.  Family Medical/ Social History: Changes? No  MENTAL HEALTH EXAM:  There were no vitals taken for this visit.There is no height or weight on file to calculate BMI.  General Appearance: Unable to assess  Eye Contact:  Unable to assess  Speech:  Clear and Coherent  Volume:  Normal  Mood:  Euthymic  Affect:  Unable to assess  Thought Process:  Goal Directed  Orientation:  Full (Time, Place, and Person)  Thought Content: Logical   Suicidal Thoughts:  No  Homicidal Thoughts:  No  Memory:  WNL  Judgement:  Good  Insight:  Good  Psychomotor Activity:  Unable to assess  Concentration:  Concentration: Good  Recall:  Good  Fund of Knowledge: Good  Language: Good  Assets:  Desire for Improvement  ADL's:  Intact  Cognition: WNL  Prognosis:  Good    DIAGNOSES:    ICD-10-CM   1. Posttraumatic stress disorder  F43.10   2. Restless leg syndrome  G25.81   3. Generalized anxiety disorder  F41.1   4. Insomnia, unspecified type  G47.00   5. Depression, unspecified depression type  F32.9     Receiving Psychotherapy: Yes With Rinaldo Cloud, LCSW   RECOMMENDATIONS:  Discontinue  Ambien. Discontinue Cymbalta 30 mg, she is already done so. Increase Xanax 1 mg from 1 twice daily up to 1 during the day as needed and 1.5 during the evening as needed, up to 3 times daily as needed.  I would like for her to stay at the lowest dose possible however.  She understands. Continue Abilify 2 mg every morning. Continue Cymbalta 60 mg every morning. Continue hydroxyzine 25 mg 3 times daily as needed.  Start Topamax 25 mg 1 nightly for 4 nights and then increase to 2 nightly.  In 2 to 3 weeks, we can increase that and she can call if needed.  She understands the benefits, risks, side effects and accepts.  The goals for this drug are to decrease appetite and cause weight loss, along with exercise and healthy diet.  It can also help with sleep as well as anxiety. Continue therapy with Rinaldo Cloud, LCSW as they decide. Return in 4 to 6 weeks.  Donnal Moat, PA-C

## 2019-05-03 ENCOUNTER — Telehealth: Payer: Self-pay | Admitting: Physician Assistant

## 2019-05-03 NOTE — Telephone Encounter (Signed)
She can d/c the Topamax.  Jarold Motto is a good option.  It'll be expensive w/o copay card though, so if she wants to wait and pick that up at [facility] Friday, we can do that, or mail her the card.  Let me know her choice and I'll send in Rx.

## 2019-05-03 NOTE — Telephone Encounter (Signed)
Left detailed voicemail with information and to call back if wanting Rx submitted prior to appt.

## 2019-05-03 NOTE — Telephone Encounter (Signed)
Pt left message. Having side effects from Topamax 25 mg. Ask for another med to be sent in. [phone]

## 2019-05-04 ENCOUNTER — Other Ambulatory Visit: Payer: Self-pay | Admitting: Physician Assistant

## 2019-05-04 MED ORDER — DAYVIGO 5 MG PO TABS: 5.0000 mg | ORAL_TABLET | Freq: Every evening | ORAL | 0 refills | Status: DC | PRN

## 2019-05-04 NOTE — Telephone Encounter (Signed)
Insurance does not cover the [employer]

## 2019-05-04 NOTE — Telephone Encounter (Signed)
Rx for [facility] sent to [facility] [address].

## 2019-05-06 ENCOUNTER — Other Ambulatory Visit: Payer: Self-pay | Admitting: Physician Assistant

## 2019-05-06 NOTE — Telephone Encounter (Signed)
appt tomorrow, not sure if labs done

## 2019-05-07 ENCOUNTER — Ambulatory Visit: Payer: BC Managed Care – PPO | Admitting: Physician Assistant

## 2019-05-07 ENCOUNTER — Other Ambulatory Visit: Payer: Self-pay

## 2019-05-10 ENCOUNTER — Encounter: Payer: Self-pay | Admitting: Adult Health

## 2019-05-10 ENCOUNTER — Ambulatory Visit (INDEPENDENT_AMBULATORY_CARE_PROVIDER_SITE_OTHER): Payer: BC Managed Care – PPO | Admitting: Adult Health

## 2019-05-10 ENCOUNTER — Other Ambulatory Visit: Payer: Self-pay

## 2019-05-10 DIAGNOSIS — F32A Depression, unspecified: Secondary | ICD-10-CM

## 2019-05-10 DIAGNOSIS — F411 Generalized anxiety disorder: Secondary | ICD-10-CM

## 2019-05-10 DIAGNOSIS — F4310 Post-traumatic stress disorder, unspecified: Secondary | ICD-10-CM | POA: Diagnosis not present

## 2019-05-10 DIAGNOSIS — F329 Major depressive disorder, single episode, unspecified: Secondary | ICD-10-CM | POA: Diagnosis not present

## 2019-05-10 DIAGNOSIS — G2581 Restless legs syndrome: Secondary | ICD-10-CM

## 2019-05-10 DIAGNOSIS — G4700 Insomnia, unspecified: Secondary | ICD-10-CM

## 2019-05-10 MED ORDER — LATUDA 20 MG PO TABS: 20.0000 mg | ORAL_TABLET | Freq: Every day | ORAL | 2 refills | Status: DC

## 2019-05-10 NOTE — Progress Notes (Signed)
Isabella Martin [id number] November 08, 1980 38 y.o.  Subjective:   Patient ID:  Isabella Martin is a 38 y.o. (DOB 20-Nov-1980) female.  Chief Complaint: No chief complaint on file.   HPI Ailene Royal presents to the office today for follow-up of   Describes mood today as "not so good". Pleasant. Mood symptoms - reports depression, anxiety, and irritability. Stating "the last two weeks have been bad for me". Left Xanax at mother's house and has been without medication for 2 weeks. Stating "I question everything". Has "sleep" anxiety due to "worrying". Not sleeping wel - "getting 2 to 3 hours". Can't get to sleep because mind is always "going". Laying in the bed and tosses and turns. Not doing things around the house - "sulking". Stating "that's not me". 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 Felt like a "zombie" on the increase of Cymbalta. Works from home until the first of the year. Tearful all the times - son 31 at home.Stable interest and motivation. Taking medications as prescribed.  Energy levels Active, does not have a regular exercise routine. Works full-time  Enjoys some usual interests and activities. Spending time with family. Appetite adequate. Weight gain Sleeps well most nights. Averages 2 to 3 hours. Feels like mind races when she lays down. Questions everything. Has sleep anxiety. Focus and concentration stable. Completing tasks. Managing aspects of household.  Denies SI or HI. Denies AH or VH.  Past medications for mental health diagnoses include: Sonata, Zoloft, Lunesta, Lexapro, Celexa, Prozac, Wellbutrin caused anger and irritability, Ambien, Xanax, trazodone  Allergies: Nitrofurantoin, Sulfonamide derivatives, and Wellbutrin [bupropion]  Review of Systems:  Review of Systems  Musculoskeletal: Negative for gait problem.  Neurological: Negative for tremors.  Psychiatric/Behavioral:       Please refer to HPI    Medications: I have reviewed the patient's current  medications.  Current Outpatient Medications  Medication Sig Dispense Refill  . ALPRAZolam (XANAX) 1 MG tablet Take 1 tablet (1 mg total) by mouth 3 (three) times daily as needed for anxiety. Max 2.5 per day for now. 75 tablet 1  . DULoxetine (CYMBALTA) 60 MG capsule TAKE 1 CAPSULE BY MOUTH EVERY DAY IN THE MORNING 90 capsule 0  . hydrOXYzine (ATARAX/VISTARIL) 25 MG tablet Take 1 tablet (25 mg total) by mouth 3 (three) times daily as needed. 60 tablet 1  . Lemborexant (DAYVIGO) 5 MG TABS Take 5 mg by mouth at bedtime as needed. 30 tablet 0  . lurasidone (LATUDA) 20 MG TABS tablet Take 1 tablet (20 mg total) by mouth at bedtime. 30 tablet 2  . omeprazole (PRILOSEC) 20 MG capsule Take 2 capsules (40 mg total) by mouth daily. For 4 weeks (Patient not taking: Reported on 08/07/2018) 60 capsule 0  . pantoprazole (PROTONIX) 40 MG tablet Take by mouth daily as needed.     . penicillin v potassium (VEETID) 500 MG tablet Take 1 tablet (500 mg total) by mouth 3 (three) times daily. (Patient not taking: Reported on 06/01/2018) 30 tablet 0  . Vitamin D, Ergocalciferol, (DRISDOL) 1.25 MG (50000 UT) CAPS capsule TAKE 1 CAPSULE BY MOUTH EVERY 7 (SEVEN) DAYS 12 WEEKS, THEN REPEAT LABS 12 capsule 0   No current facility-administered medications for this visit.     Medication Side Effects: None  Allergies:  Allergies  Allergen Reactions  . Nitrofurantoin   . Sulfonamide Derivatives   . Wellbutrin [Bupropion] Other (See Comments)    Agitation and irritability    Past Medical History:  Diagnosis Date  . Depression  Family History  Problem Relation Age of Onset  . Cancer Father     Social History   Socioeconomic History  . Marital status: Married    Spouse name: Not on file  . Number of children: Not on file  . Years of education: Not on file  . Highest education level: Not on file  Occupational History  . Not on file  Social Needs  . Financial resource strain: Not on file  . Food  insecurity    Worry: Not on file    Inability: Not on file  . Transportation needs    Medical: Not on file    Non-medical: Not on file  Tobacco Use  . Smoking status: Current Every Day Smoker    Packs/day: 0.50    Types: Cigarettes  . Smokeless tobacco: Never Used  Substance and Sexual Activity  . Alcohol use: Yes    Comment: 1 drink a month  . Drug use: No  . Sexual activity: Yes  Lifestyle  . Physical activity    Days per week: Not on file    Minutes per session: Not on file  . Stress: Not on file  Relationships  . Social Musician on phone: Not on file    Gets together: Not on file    Attends religious service: Not on file    Active member of club or organization: Not on file    Attends meetings of clubs or organizations: Not on file    Relationship status: Not on file  . Intimate partner violence    Fear of current or ex partner: Not on file    Emotionally abused: Not on file    Physically abused: Not on file    Forced sexual activity: Not on file  Other Topics Concern  . Not on file  Social History Narrative  . Not on file    Past Medical History, Surgical history, Social history, and Family history were reviewed and updated as appropriate.   Please see review of systems for further details on the patient's review from today.   Objective:   Physical Exam:  There were no vitals taken for this visit.  Physical Exam Constitutional:      General: She is not in acute distress.    Appearance: She is well-developed.  Musculoskeletal:        General: No deformity.  Neurological:     Mental Status: She is alert and oriented to person, place, and time.     Coordination: Coordination normal.  Psychiatric:        Attention and Perception: Attention and perception normal. She does not perceive auditory or visual hallucinations.        Mood and Affect: Mood is anxious and depressed. Affect is labile. Affect is not blunt, angry or inappropriate.         Speech: Speech normal.        Behavior: Behavior normal.        Thought Content: Thought content normal. Thought content is not paranoid or delusional. Thought content does not include homicidal or suicidal ideation. Thought content does not include homicidal or suicidal plan.        Cognition and Memory: Cognition and memory normal.        Judgment: Judgment normal.     Comments: Insight intact     Lab Review:  No results found for: NA, K, CL, CO2, GLUCOSE, BUN, CREATININE, CALCIUM, PROT, ALBUMIN, AST, ALT, ALKPHOS, BILITOT, GFRNONAA, GFRAA  No results found for: WBC, RBC, HGB, HCT, PLT, MCV, MCH, MCHC, RDW, LYMPHSABS, MONOABS, EOSABS, BASOSABS  No results found for: POCLITH, LITHIUM   No results found for: PHENYTOIN, PHENOBARB, VALPROATE, CBMZ   .res Assessment: Plan:     Plan: Discontinue Abilify 2 mg every morning. Continue Cymbalta 60 mg every morning. Continue hydroxyzine 25 mg 3 times daily as needed - takes occasionally Add Latuda 20mg  - 1/2 x 7, then one tab daily with food 350 calories at hs Xanax 1mg  TID - can fill early - left medications at mothers house - discussed controlled substance policy - no fuerther early refills.  RTC 4 weeks  Patient advised to contact office with any questions, adverse effects, or acute worsening in signs and symptoms.  Diagnoses and all orders for this visit:  Generalized anxiety disorder -     lurasidone (LATUDA) 20 MG TABS tablet; Take 1 tablet (20 mg total) by mouth at bedtime.  Insomnia, unspecified type -     lurasidone (LATUDA) 20 MG TABS tablet; Take 1 tablet (20 mg total) by mouth at bedtime.  Posttraumatic stress disorder  Depression, unspecified depression type -     lurasidone (LATUDA) 20 MG TABS tablet; Take 1 tablet (20 mg total) by mouth at bedtime.  Restless leg syndrome     Please see After Visit Summary for patient specific instructions.  Future Appointments  Date Time Provider Department Center  05/24/2019  11:00 AM Melony OverlyHurst, Teresa T, PA-C [facility] None    No orders of the defined types were placed in this encounter.   -------------------------------

## 2019-05-16 ENCOUNTER — Other Ambulatory Visit: Payer: Self-pay | Admitting: Physician Assistant

## 2019-05-24 ENCOUNTER — Ambulatory Visit (INDEPENDENT_AMBULATORY_CARE_PROVIDER_SITE_OTHER): Payer: BC Managed Care – PPO | Admitting: Physician Assistant

## 2019-05-24 ENCOUNTER — Encounter: Payer: Self-pay | Admitting: Physician Assistant

## 2019-05-24 ENCOUNTER — Other Ambulatory Visit: Payer: Self-pay

## 2019-05-24 DIAGNOSIS — R635 Abnormal weight gain: Secondary | ICD-10-CM

## 2019-05-24 DIAGNOSIS — F411 Generalized anxiety disorder: Secondary | ICD-10-CM

## 2019-05-24 DIAGNOSIS — F32A Depression, unspecified: Secondary | ICD-10-CM

## 2019-05-24 DIAGNOSIS — F332 Major depressive disorder, recurrent severe without psychotic features: Secondary | ICD-10-CM | POA: Diagnosis not present

## 2019-05-24 DIAGNOSIS — G4700 Insomnia, unspecified: Secondary | ICD-10-CM | POA: Diagnosis not present

## 2019-05-24 DIAGNOSIS — E559 Vitamin D deficiency, unspecified: Secondary | ICD-10-CM | POA: Diagnosis not present

## 2019-05-24 DIAGNOSIS — G2581 Restless legs syndrome: Secondary | ICD-10-CM

## 2019-05-24 DIAGNOSIS — F329 Major depressive disorder, single episode, unspecified: Secondary | ICD-10-CM | POA: Diagnosis not present

## 2019-05-24 DIAGNOSIS — F4310 Post-traumatic stress disorder, unspecified: Secondary | ICD-10-CM

## 2019-05-24 DIAGNOSIS — R5383 Other fatigue: Secondary | ICD-10-CM

## 2019-05-24 MED ORDER — DULOXETINE HCL 30 MG PO CPEP: 30.0000 mg | ORAL_CAPSULE | Freq: Every day | ORAL | 3 refills | Status: DC

## 2019-05-24 MED ORDER — ZOLPIDEM TARTRATE 10 MG PO TABS: 10.0000 mg | ORAL_TABLET | Freq: Every evening | ORAL | 0 refills | Status: DC | PRN

## 2019-05-24 NOTE — Progress Notes (Signed)
[facility]  Patient ID: Isabella Martin,  MRN: [id number]  PCP: System, Provider Not In  Date of Evaluation: 05/24/2019 Time spent:25 minutes  Chief Complaint:  Chief Complaint    Depression; Anxiety; Insomnia; Follow-up     Virtual Visit via Telephone Note  I connected with patient by a video enabled telemedicine application or telephone, with their informed consent, and verified patient privacy and that I am speaking with the correct person using two identifiers.  I am private, in my office and the patient is home.  I discussed the limitations, risks, security and privacy concerns of performing an evaluation and management service by telephone and the availability of in person appointments. I also discussed with the patient that there may be a patient responsible charge related to this service. The patient expressed understanding and agreed to proceed.   I discussed the assessment and treatment plan with the patient. The patient was provided an opportunity to ask questions and all were answered. The patient agreed with the plan and demonstrated an understanding of the instructions.   The patient was advised to call back or seek an in-person evaluation if the symptoms worsen or if the condition fails to improve as anticipated.  I provided 25 minutes of non-face-to-face time during this encounter.  HISTORY/CURRENT STATUS: HPI More depression.  For a month now, has been much more depressed. Doesn't want to do anything, not clean her house, go out etc. Cries easily.  'Feels lost right now.' At home all the time by herself and is lonely. Is working from home, but pushes herself to work.  At 5:00 all she wants to do is lay down.  Appetite is ok.  She's purposely not eating as much d/t wt gain.  "That gets me down too.  Has gained about 66 # over the past year."  Saw Edison Pace, NP a few weeks ago who ordered [place].  But pt unable to get it d/t insurance issues.    Patient increased the Cymbalta to 90 mg last week.  She had Cymbalta 30 mg left over and was able to get to that dose.  She thinks it might be helping a little bit.  Still having anxiety 'mixed w/ depression.  I don't know how to explain it.  I'm just getting tired.'  Tearfully says she is tired of feeling like this.  Denies SI.   Feels hopeless.  Hasn't been seeing Rockne Menghini for counseling b/c she'd been doing well.  But this past month has 'been awful.'  States there has probably been some cyclical pattern over the years now that she thinks about it.  But denies increased energy with decreased need for sleep, no impulsivity or risky behaviors.  No increased libido or spending, no grandiosity.  Review of Systems  Constitutional: Negative.   HENT: Negative.   Eyes: Negative.   Respiratory: Negative.   Cardiovascular: Negative.   Gastrointestinal: Negative.   Genitourinary: Negative.   Musculoskeletal: Negative.   Skin: Negative.   Neurological: Positive for headaches. Negative for dizziness, tingling, tremors, sensory change, speech change, focal weakness, seizures, loss of consciousness and weakness.  Endo/Heme/Allergies: Negative.   Psychiatric/Behavioral: Positive for depression. Negative for hallucinations, memory loss, substance abuse and suicidal ideas. The patient is nervous/anxious and has insomnia.    Individual Medical History/ Review of Systems: Changes? :No    Past medications for mental health diagnoses include: Sonata, Zoloft, Lunesta, Lexapro, Celexa, Prozac, Wellbutrin caused anger and irritability, Ambien caused h/a and it's  the only thing that helped her get her any sleep,  Xanax, trazodone, Dayvigo caused horrible H/A and didn't work at all.  Abilify caused extreme wt gain, Mirtazepine cause wt gain.   Allergies: Nitrofurantoin, Sulfonamide derivatives, and Wellbutrin [bupropion]  Current Medications:  Current Outpatient Medications:  .  ALPRAZolam (XANAX) 1 MG  tablet, Take 1 tablet (1 mg total) by mouth 3 (three) times daily as needed for anxiety. Max 2.5 per day for now., Disp: 75 tablet, Rfl: 1 .  DULoxetine (CYMBALTA) 60 MG capsule, TAKE 1 CAPSULE BY MOUTH EVERY DAY IN THE MORNING (Patient taking differently: 90 mg. ), Disp: 90 capsule, Rfl: 0 .  hydrOXYzine (ATARAX/VISTARIL) 25 MG tablet, Take 1 tablet (25 mg total) by mouth 3 (three) times daily as needed., Disp: 60 tablet, Rfl: 1 .  pantoprazole (PROTONIX) 40 MG tablet, Take by mouth daily as needed. , Disp: , Rfl:  .  Vitamin D, Ergocalciferol, (DRISDOL) 1.25 MG (50000 UT) CAPS capsule, TAKE 1 CAPSULE BY MOUTH EVERY 7 (SEVEN) DAYS 12 WEEKS, THEN REPEAT LABS, Disp: 12 capsule, Rfl: 0 .  DULoxetine (CYMBALTA) 30 MG capsule, Take 1 capsule (30 mg total) by mouth daily., Disp: 90 capsule, Rfl: 3 .  lurasidone (LATUDA) 20 MG TABS tablet, Take 1 tablet (20 mg total) by mouth at bedtime. (Patient not taking: Reported on 05/24/2019), Disp: 30 tablet, Rfl: 2 .  omeprazole (PRILOSEC) 20 MG capsule, Take 2 capsules (40 mg total) by mouth daily. For 4 weeks (Patient not taking: Reported on 05/24/2019), Disp: 60 capsule, Rfl: 0 .  penicillin v potassium (VEETID) 500 MG tablet, Take 1 tablet (500 mg total) by mouth 3 (three) times daily. (Patient not taking: Reported on 06/01/2018), Disp: 30 tablet, Rfl: 0 .  zolpidem (AMBIEN) 10 MG tablet, Take 1 tablet (10 mg total) by mouth at bedtime as needed for sleep., Disp: 30 tablet, Rfl: 0 Medication Side Effects: none  Family Medical/ Social History: Changes? No  MENTAL HEALTH EXAM:  There were no vitals taken for this visit.There is no height or weight on file to calculate BMI.  General Appearance: Unable to assess  Eye Contact:  Unable to assess  Speech:  Clear and Coherent  Volume:  Normal  Mood:  Depressed  Affect:  Depressed and Tearful  Thought Process:  Goal Directed and Descriptions of Associations: Intact  Orientation:  Full (Time, Place, and Person)   Thought Content: Logical   Suicidal Thoughts:  No  Homicidal Thoughts:  No  Memory:  WNL  Judgement:  Good  Insight:  Good  Psychomotor Activity:  Unable to assess  Concentration:  Concentration: Good  Recall:  Good  Fund of Knowledge: Good  Language: Good  Assets:  Desire for Improvement  ADL's:  Intact  Cognition: WNL  Prognosis:  Good    DIAGNOSES:    ICD-10-CM   1. Insomnia, unspecified type  G47.00   2. Severe episode of recurrent major depressive disorder, without psychotic features (HCC)  F33.2   3. Fatigue due to depression  F32.9    R53.83   4. Vitamin D deficiency  E55.9   5. Restless leg syndrome  G25.81   6. Posttraumatic stress disorder  F43.10   7. Generalized anxiety disorder  F41.1   8. Weight gain due to medication  R63.5    T50.905A   Questionable Bipolar II disorder  Receiving Psychotherapy: Yes With Rockne Menghiniebbie Dowd, LCSW.   RECOMMENDATIONS:  Continue Cymbalta 90 mg, daily.  She increased at last week  so we will stay on that. Continue Xanax 1 mg 3 times daily as needed. Continue hydroxyzine 25 mg 3 times daily as needed. I hope that we can get the [place] approved.  We will do our best to get a prior off done and get her started on that as soon as possible. Start Latuda 20 mg q. evening with food.  I stressed that importance. Continue Ambien 10 mg q. evening as needed.  She still has some on hand, I believe. We will discuss the weight issues later on.  We need to consider naltrexone, phentermine. Get back in with Rinaldo Cloud as soon as possible. Return in 4 weeks but she knows to contact our office, call 911 or go to the ER if she becomes suicidal.  Donnal Moat, PA-C

## 2019-05-25 ENCOUNTER — Telehealth: Payer: Self-pay

## 2019-05-25 ENCOUNTER — Other Ambulatory Visit: Payer: Self-pay | Admitting: Physician Assistant

## 2019-05-25 MED ORDER — QUETIAPINE FUMARATE 100 MG PO TABS: 100.0000 mg | ORAL_TABLET | Freq: Every day | ORAL | 0 refills | Status: DC

## 2019-05-25 NOTE — Telephone Encounter (Signed)
Prior authorization submitted and DENIED for latuda 20 mg per [employer], pt must have tried and failed at least 2 formulary meds listed below. Donnal Moat PA-C and patient aware and new medication prescribed for trial that is on her formulary: Required formulary alternative(s): aripiprazole, clozapine, olanzapine, quetiapine, quetiapine ER, risperidone, ziprasidone, Vraylar   Patient has already tried Aripiprazole

## 2019-05-25 NOTE — Progress Notes (Signed)
PA was denied for Latuda b/c she's only used Abilify out of that class of meds.  Will send in Seroquel.  If effective, great, if not, will try for Latuda again.

## 2019-06-18 ENCOUNTER — Other Ambulatory Visit: Payer: Self-pay | Admitting: Physician Assistant

## 2019-06-20 NOTE — Telephone Encounter (Signed)
Apt 12/09

## 2019-06-30 ENCOUNTER — Other Ambulatory Visit: Payer: Self-pay

## 2019-06-30 ENCOUNTER — Ambulatory Visit (INDEPENDENT_AMBULATORY_CARE_PROVIDER_SITE_OTHER): Payer: BC Managed Care – PPO | Admitting: Physician Assistant

## 2019-06-30 ENCOUNTER — Encounter: Payer: Self-pay | Admitting: Physician Assistant

## 2019-06-30 DIAGNOSIS — G2581 Restless legs syndrome: Secondary | ICD-10-CM

## 2019-06-30 DIAGNOSIS — T50905A Adverse effect of unspecified drugs, medicaments and biological substances, initial encounter: Secondary | ICD-10-CM

## 2019-06-30 DIAGNOSIS — F331 Major depressive disorder, recurrent, moderate: Secondary | ICD-10-CM | POA: Diagnosis not present

## 2019-06-30 DIAGNOSIS — G4700 Insomnia, unspecified: Secondary | ICD-10-CM | POA: Diagnosis not present

## 2019-06-30 DIAGNOSIS — E559 Vitamin D deficiency, unspecified: Secondary | ICD-10-CM

## 2019-06-30 DIAGNOSIS — G2571 Drug induced akathisia: Secondary | ICD-10-CM | POA: Diagnosis not present

## 2019-06-30 DIAGNOSIS — F411 Generalized anxiety disorder: Secondary | ICD-10-CM

## 2019-06-30 MED ORDER — ALPRAZOLAM 1 MG PO TABS: 1.0000 mg | ORAL_TABLET | Freq: Three times a day (TID) | ORAL | 1 refills | Status: DC | PRN

## 2019-06-30 MED ORDER — LATUDA 20 MG PO TABS: 20.0000 mg | ORAL_TABLET | Freq: Every day | ORAL | 1 refills | Status: DC

## 2019-06-30 NOTE — Progress Notes (Signed)
[facility]  Patient ID: Isabella Martin,  MRN: [id number]  PCP: System, Provider Not In  Date of Evaluation: 06/30/2019 Time spent:25 minutes  Chief Complaint:  Chief Complaint    Follow-up     Virtual Visit via Telephone Note  I connected with patient by a video enabled telemedicine application or telephone, with their informed consent, and verified patient privacy and that I am speaking with the correct person using two identifiers.  I am private, in my office and the patient is at home.   I discussed the limitations, risks, security and privacy concerns of performing an evaluation and management service by telephone and the availability of in person appointments. I also discussed with the patient that there may be a patient responsible charge related to this service. The patient expressed understanding and agreed to proceed.   I discussed the assessment and treatment plan with the patient. The patient was provided an opportunity to ask questions and all were answered. The patient agreed with the plan and demonstrated an understanding of the instructions.   The patient was advised to call back or seek an in-person evaluation if the symptoms worsen or if the condition fails to improve as anticipated.  I provided 25 minutes of non-face-to-face time during this encounter.  HISTORY/CURRENT STATUS: HPI For routine med check.  Not crying like she was.  But still feels depressed.  She does not have a lot of energy or motivation.  Does not really enjoy much.  She was not able to go on the [place] because her insurance would not pay for it.  We started her on Seroquel which she has been taking about 3 to 4 weeks.  For about 2 weeks now, she feels jittery on the inside all the time.  She has periods of times throughout the day where she just cannot sit still.  She has to get up and move from one chair to another.  Unable to control this feeling.  She is kind of shaky all over to.   Feels that it may be related to the hydroxyzine but it did not start until a few weeks ago and she has been on the hydroxyzine for months now.  She denies suicidal or homicidal thoughts.  She is working and that is going well.  Anxiety is about the same.  The Xanax does help.  With this jittery feeling inside she has been a bit more anxious.  No panic attacks.  Feels that the restless leg syndrome is worse.  It seems that if she takes the hydroxyzine late in the evening it is worse.  She did not take the hydroxyzine last night and it was not as bad.  She is sleeping better with the Ambien and is not having headaches from it like she was.  She is now taking it and then laying down right away.  That way she does not have a headache before going to bed.  Patient denies increased energy with decreased need for sleep, no increased talkativeness, no racing thoughts, no impulsivity or risky behaviors, no increased spending, no increased libido, no grandiosity.  Denies dizziness, syncope, seizures, numbness, tingling, tics, unsteady gait, slurred speech, confusion. Denies muscle or joint pain, stiffness, or dystonia.  See HPI.  Individual Medical History/ Review of Systems: Changes? :No    Past medications for mental health diagnoses include: Sonata, Zoloft, Lunesta, Lexapro, Celexa, Prozac, Wellbutrin caused anger and irritability, Ambien caused h/a and it's the only thing that helped her  get her any sleep,  Xanax, trazodone, Dayvigo caused horrible H/A and didn't work at all.  Abilify caused extreme wt gain, Mirtazepine cause wt gain  Allergies: Nitrofurantoin, Sulfonamide derivatives, and Wellbutrin [bupropion]  Current Medications:  Current Outpatient Medications:  .  ALPRAZolam (XANAX) 1 MG tablet, Take 1 tablet (1 mg total) by mouth 3 (three) times daily as needed for anxiety. Max 2.5 per day for now., Disp: 75 tablet, Rfl: 1 .  DULoxetine (CYMBALTA) 30 MG capsule, Take 1 capsule (30 mg total)  by mouth daily., Disp: 90 capsule, Rfl: 3 .  DULoxetine (CYMBALTA) 60 MG capsule, TAKE 1 CAPSULE BY MOUTH EVERY DAY IN THE MORNING (Patient taking differently: 90 mg. ), Disp: 90 capsule, Rfl: 0 .  hydrOXYzine (ATARAX/VISTARIL) 25 MG tablet, Take 1 tablet (25 mg total) by mouth 3 (three) times daily as needed., Disp: 60 tablet, Rfl: 1 .  pantoprazole (PROTONIX) 40 MG tablet, Take by mouth daily as needed. , Disp: , Rfl:  .  QUEtiapine (SEROQUEL) 100 MG tablet, TAKE 1 TABLET BY MOUTH EVERYDAY AT BEDTIME, Disp: 90 tablet, Rfl: 0 .  Vitamin D, Ergocalciferol, (DRISDOL) 1.25 MG (50000 UT) CAPS capsule, TAKE 1 CAPSULE BY MOUTH EVERY 7 (SEVEN) DAYS 12 WEEKS, THEN REPEAT LABS, Disp: 12 capsule, Rfl: 0 .  zolpidem (AMBIEN) 10 MG tablet, TAKE 1 TABLET BY MOUTH AT BEDTIME AS NEEDED FOR SLEEP., Disp: 30 tablet, Rfl: 0 .  lurasidone (LATUDA) 20 MG TABS tablet, Take 1 tablet (20 mg total) by mouth daily with supper., Disp: 30 tablet, Rfl: 1 .  omeprazole (PRILOSEC) 20 MG capsule, Take 2 capsules (40 mg total) by mouth daily. For 4 weeks (Patient not taking: Reported on 06/30/2019), Disp: 60 capsule, Rfl: 0 .  penicillin v potassium (VEETID) 500 MG tablet, Take 1 tablet (500 mg total) by mouth 3 (three) times daily. (Patient not taking: Reported on 06/01/2018), Disp: 30 tablet, Rfl: 0 Medication Side Effects: none  Family Medical/ Social History: Changes? No  MENTAL HEALTH EXAM:  There were no vitals taken for this visit.There is no height or weight on file to calculate BMI.  General Appearance: Unable to assess  Eye Contact:  Unable to assess  Speech:  Clear and Coherent  Volume:  Normal  Mood:  Euthymic  Affect:  Unable to assess  Thought Process:  Goal Directed and Descriptions of Associations: Intact  Orientation:  Full (Time, Place, and Person)  Thought Content: Logical   Suicidal Thoughts:  No  Homicidal Thoughts:  No  Memory:  WNL  Judgement:  Good  Insight:  Good  Psychomotor Activity:   Unable to assess  Concentration:  Concentration: Good  Recall:  Good  Fund of Knowledge: Good  Language: Good  Assets:  Desire for Improvement  ADL's:  Intact  Cognition: WNL  Prognosis:  Good    DIAGNOSES:    ICD-10-CM   1. Major depressive disorder, recurrent episode, moderate (HCC)  F33.1   2. Acute medication-induced akathisia  G25.71    T50.905A   3. Insomnia, unspecified type  G47.00   4. Restless leg syndrome  G25.81   5. Vitamin D deficiency  E55.9   6. Generalized anxiety disorder  F41.1     Receiving Psychotherapy: Yes    RECOMMENDATIONS:  I spent 25 minutes with her phone to phone and 50% of that time was in counseling concerning her new symptoms of what sound like akathisia.  We discussed the possible causes and how to treat. Wean off Seroquel  100 mg 1/2 qhs until can get Latuda.  We have already tried to get a prior authorization but it was denied last time.  I think it will go through this time since she has tried Abilify before and now the Seroquel which is causing intolerable side effects. Continue Xanax 1 mg 3 times daily as needed.  Of course keep the dose as low as possible. Continue Cymbalta 90 mg daily. Stop the hydroxyzine for now.  It may be a part of the problem of worsening restless leg. Continue Ambien 10 mg nightly as needed. Continue psychotherapy  Return in 4 weeks.   [name], PA-C

## 2019-07-02 ENCOUNTER — Other Ambulatory Visit: Payer: Self-pay | Admitting: Physician Assistant

## 2019-07-02 ENCOUNTER — Telehealth: Payer: Self-pay

## 2019-07-02 MED ORDER — CARIPRAZINE HCL 1.5 MG PO CAPS: 1.5000 mg | ORAL_CAPSULE | Freq: Every day | ORAL | 0 refills | Status: DC

## 2019-07-02 NOTE — Telephone Encounter (Signed)
Prior authorization submitted through cover my meds for Latuda 20 mg 1/daily, denied with [facility].  Required formulary alternative(s): aripiprazole, clozapine, olanzapine, quetiapine, quetiapine ER, risperidone, ziprasidone, Vraylar. Requirement: Trial and Failure of all of the formulary alternatives, but not more than three   Patient has tried and failed: Aripiprazole and Quetiapine    Will notify Helene Kelp then call back to discuss recommendation

## 2019-07-02 NOTE — Telephone Encounter (Signed)
Great..So we have to try 3 before they'll approve Latuda?  Okay let us go with Vraylar.  I need to send it in to [facility] right?

## 2019-07-02 NOTE — Telephone Encounter (Signed)
Pt. Made aware.

## 2019-07-02 NOTE — Telephone Encounter (Signed)
Rx sent 

## 2019-07-08 ENCOUNTER — Other Ambulatory Visit: Payer: Self-pay | Admitting: Physician Assistant

## 2019-07-11 ENCOUNTER — Other Ambulatory Visit: Payer: Self-pay | Admitting: Physician Assistant

## 2019-07-12 NOTE — Telephone Encounter (Signed)
Apt 08/11/2019

## 2019-07-26 ENCOUNTER — Other Ambulatory Visit: Payer: Self-pay | Admitting: Physician Assistant

## 2019-07-26 NOTE — Telephone Encounter (Signed)
I know she had several changes due to PA issues. I believe she is on Vraylar now  Also is she still taking Vitamin D?

## 2019-08-11 ENCOUNTER — Encounter: Payer: Self-pay | Admitting: Physician Assistant

## 2019-08-11 ENCOUNTER — Ambulatory Visit (INDEPENDENT_AMBULATORY_CARE_PROVIDER_SITE_OTHER): Payer: BC Managed Care – PPO | Admitting: Physician Assistant

## 2019-08-11 ENCOUNTER — Telehealth: Payer: Self-pay | Admitting: Physician Assistant

## 2019-08-11 DIAGNOSIS — F411 Generalized anxiety disorder: Secondary | ICD-10-CM | POA: Diagnosis not present

## 2019-08-11 DIAGNOSIS — G2581 Restless legs syndrome: Secondary | ICD-10-CM | POA: Diagnosis not present

## 2019-08-11 DIAGNOSIS — G4700 Insomnia, unspecified: Secondary | ICD-10-CM | POA: Diagnosis not present

## 2019-08-11 DIAGNOSIS — E559 Vitamin D deficiency, unspecified: Secondary | ICD-10-CM

## 2019-08-11 DIAGNOSIS — F331 Major depressive disorder, recurrent, moderate: Secondary | ICD-10-CM

## 2019-08-11 DIAGNOSIS — F4310 Post-traumatic stress disorder, unspecified: Secondary | ICD-10-CM

## 2019-08-11 MED ORDER — ALPRAZOLAM 1 MG PO TABS: 1.0000 mg | ORAL_TABLET | Freq: Three times a day (TID) | ORAL | 5 refills | Status: DC | PRN

## 2019-08-11 MED ORDER — CARIPRAZINE HCL 1.5 MG PO CAPS: 1.5000 mg | ORAL_CAPSULE | Freq: Every day | ORAL | 2 refills | Status: DC

## 2019-08-11 MED ORDER — ZOLPIDEM TARTRATE 10 MG PO TABS: 10.0000 mg | ORAL_TABLET | Freq: Every evening | ORAL | 5 refills | Status: DC | PRN

## 2019-08-11 NOTE — Telephone Encounter (Signed)
Reviewed, pt got early from Dec to Jan and now asking for early again.  I originally ok'ed it but pharm won't fill early.  I agree, after this review.  Pt was made aware during appt that pharm likely wouldn't fill.  She knows not to take more than written, I understand was taking it more d/t tremor.  But that shouldn't have been the reason for 2 early Rfs.

## 2019-08-11 NOTE — Telephone Encounter (Signed)
Patient called and said that she had a  visit with teresa today.Rosey Bath sent in scrher scripts of xanax and Remus Loffler. The pharmacy will not fill those because it is 2 days early. The pharmacy will fill them if teresa calls or nurse calls to say it is ok Please call the pharmacy

## 2019-08-11 NOTE — Progress Notes (Signed)
[facility]  Patient ID: Isabella Martin,  MRN: [id number]  PCP: System, Provider Not In  Date of Evaluation: 08/11/2019 Time spent:20 minutes  Chief Complaint:  Chief Complaint    Anxiety; Depression; Insomnia; Follow-up     Virtual Visit via Telephone Note  I connected with patient by a video enabled telemedicine application or telephone, with their informed consent, and verified patient privacy and that I am speaking with the correct person using two identifiers.  I am private, in my office and the patient is home.   I discussed the limitations, risks, security and privacy concerns of performing an evaluation and management service by telephone and the availability of in person appointments. I also discussed with the patient that there may be a patient responsible charge related to this service. The patient expressed understanding and agreed to proceed.   I discussed the assessment and treatment plan with the patient. The patient was provided an opportunity to ask questions and all were answered. The patient agreed with the plan and demonstrated an understanding of the instructions.   The patient was advised to call back or seek an in-person evaluation if the symptoms worsen or if the condition fails to improve as anticipated.  I provided 20 minutes of non-face-to-face time during this encounter.  HISTORY/CURRENT STATUS: HPI for routine med check.  Since going on the Vraylar, patient states she is doing quite a bit better.  At least 75% better.  Only complaint with that medicine is that it makes her a little sleepy but otherwise she is okay.  The tremor has resolved too, after stopping the Seroquel.  She thinks it may have been related to the hydroxyzine as well, which was also discontinued.  She is more able to enjoy things, energy and motivation are better.  She does not cry easily.  Concentration is good.  Memory is normal.  No suicidal or homicidal  thoughts.  Patient denies increased energy with decreased need for sleep, no increased talkativeness, no racing thoughts, no impulsivity or risky behaviors, no increased spending, no increased libido, no grandiosity.  Anxiety is better controlled.  Reports taking more Xanax over this past month due to the tremor after she stopped the Seroquel.  She has never overtaken any med before.  Denies dizziness, syncope, seizures, numbness, tingling, tremor, tics, unsteady gait, slurred speech, confusion. Denies muscle or joint pain, stiffness, or dystonia.  Individual Medical History/ Review of Systems: Changes? :No    Past medications for mental health diagnoses include: Sonata, Zoloft, Lunesta, Lexapro, Celexa, Prozac, Wellbutrin caused anger and irritability, Ambiencaused h/a and it's the only thing that helped her get her any sleep,Xanax, trazodone, Dayvigo caused horrible H/A and didn't work at all.  Abilify caused extreme wt gain, Mirtazepine cause wt gain, Vraylar  Seroquel caused tremor, Hydroxyzine caused tremor.  Allergies: Nitrofurantoin, Sulfonamide derivatives, and Wellbutrin [bupropion]  Current Medications:  Current Outpatient Medications:  .  ALPRAZolam (XANAX) 1 MG tablet, Take 1 tablet (1 mg total) by mouth 3 (three) times daily as needed for anxiety. Max 2.5 per day for now., Disp: 75 tablet, Rfl: 5 .  cariprazine (VRAYLAR) capsule, Take 1 capsule (1.5 mg total) by mouth daily., Disp: 30 capsule, Rfl: 2 .  DULoxetine (CYMBALTA) 30 MG capsule, Take 1 capsule (30 mg total) by mouth daily., Disp: 90 capsule, Rfl: 3 .  DULoxetine (CYMBALTA) 60 MG capsule, TAKE 1 CAPSULE BY MOUTH EVERY DAY IN THE MORNING, Disp: 90 capsule, Rfl: 0 .  Vitamin D,  Ergocalciferol, (DRISDOL) 1.25 MG (50000 UT) CAPS capsule, TAKE 1 CAPSULE BY MOUTH EVERY 7 (SEVEN) DAYS 12 WEEKS, THEN REPEAT LABS, Disp: 12 capsule, Rfl: 0 .  zolpidem (AMBIEN) 10 MG tablet, Take 1 tablet (10 mg total) by mouth at bedtime as  needed for sleep., Disp: 30 tablet, Rfl: 5 .  omeprazole (PRILOSEC) 20 MG capsule, Take 2 capsules (40 mg total) by mouth daily. For 4 weeks (Patient not taking: Reported on 06/30/2019), Disp: 60 capsule, Rfl: 0 .  pantoprazole (PROTONIX) 40 MG tablet, Take by mouth daily as needed. , Disp: , Rfl:  .  penicillin v potassium (VEETID) 500 MG tablet, Take 1 tablet (500 mg total) by mouth 3 (three) times daily. (Patient not taking: Reported on 06/01/2018), Disp: 30 tablet, Rfl: 0 Medication Side Effects: Mild drowsiness.  Family Medical/ Social History: Changes? No  MENTAL HEALTH EXAM:  There were no vitals taken for this visit.There is no height or weight on file to calculate BMI.  General Appearance: unable to assess  Eye Contact:  unable to assess  Speech:  Clear and Coherent  Volume:  Normal  Mood:  Euthymic  Affect:  unable to assess  Thought Process:  Goal Directed and Descriptions of Associations: Intact  Orientation:  Full (Time, Place, and Person)  Thought Content: Logical   Suicidal Thoughts:  No  Homicidal Thoughts:  No  Memory:  WNL  Judgement:  Good  Insight:  Good  Psychomotor Activity:  unable to assess  Concentration:  Concentration: Good  Recall:  Good  Fund of Knowledge: Good  Language: Good  Assets:  Desire for Improvement  ADL's:  Intact  Cognition: WNL  Prognosis:  Good    DIAGNOSES:    ICD-10-CM   1. Major depressive disorder, recurrent episode, moderate (HCC)  F33.1   2. Restless leg syndrome  G25.81   3. Insomnia, unspecified type  G47.00   4. Generalized anxiety disorder  F41.1   5. Vitamin D deficiency  E55.9   6. Posttraumatic stress disorder  F43.10     Receiving Psychotherapy: No    RECOMMENDATIONS:  I am glad she is doing much better on the Vraylar! PDMP was reviewed. Continue Xanax 1 mg 1 p.o. 3 times daily as needed but try to keep this at the lowest dose possible.  I have okayed it with the pharmacy on her digital prescription that it  can be filled today. Continue Vraylar 1.5 mg daily. Continue Cymbalta 30 mg +60 mg daily. Continue Ambien 10 mg nightly as needed sleep. Continue multivitamin, vitamin D 50,000 IUs weekly. Return in 3 months.  [name], PA-C

## 2019-08-11 NOTE — Telephone Encounter (Signed)
Yes, please call pharmacy and tell them it's ok.  I wrote that on her Rfs that I sent in so not sure why they need a call too.  I'll watch and make sure this doesn't become a habit.

## 2019-09-06 ENCOUNTER — Other Ambulatory Visit: Payer: Self-pay | Admitting: Physician Assistant

## 2019-10-09 ENCOUNTER — Other Ambulatory Visit: Payer: Self-pay | Admitting: Physician Assistant

## 2019-11-17 ENCOUNTER — Encounter: Payer: Self-pay | Admitting: Physician Assistant

## 2019-11-17 ENCOUNTER — Ambulatory Visit (INDEPENDENT_AMBULATORY_CARE_PROVIDER_SITE_OTHER): Payer: BC Managed Care – PPO | Admitting: Physician Assistant

## 2019-11-17 DIAGNOSIS — F331 Major depressive disorder, recurrent, moderate: Secondary | ICD-10-CM | POA: Diagnosis not present

## 2019-11-17 DIAGNOSIS — F411 Generalized anxiety disorder: Secondary | ICD-10-CM | POA: Diagnosis not present

## 2019-11-17 DIAGNOSIS — R5383 Other fatigue: Secondary | ICD-10-CM | POA: Diagnosis not present

## 2019-11-17 DIAGNOSIS — F32A Depression, unspecified: Secondary | ICD-10-CM

## 2019-11-17 DIAGNOSIS — F329 Major depressive disorder, single episode, unspecified: Secondary | ICD-10-CM

## 2019-11-17 MED ORDER — CARIPRAZINE HCL 3 MG PO CAPS: 3.0000 mg | ORAL_CAPSULE | Freq: Every day | ORAL | 1 refills | Status: DC

## 2019-11-17 NOTE — Progress Notes (Signed)
[facility]  Patient ID: Isabella Martin,  MRN: [id number]  PCP: System, Provider Not In  Date of Evaluation: 11/17/2019 Time spent:20 minutes  Chief Complaint:  Chief Complaint    Anxiety; Depression     Virtual Visit via Telephone Note  I connected with patient by a video enabled telemedicine application or telephone, with their informed consent, and verified patient privacy and that I am speaking with the correct person using two identifiers.  I am private, in my office and the patient is home.   I discussed the limitations, risks, security and privacy concerns of performing an evaluation and management service by telephone and the availability of in person appointments. I also discussed with the patient that there may be a patient responsible charge related to this service. The patient expressed understanding and agreed to proceed.   I discussed the assessment and treatment plan with the patient. The patient was provided an opportunity to ask questions and all were answered. The patient agreed with the plan and demonstrated an understanding of the instructions.   The patient was advised to call back or seek an in-person evaluation if the symptoms worsen or if the condition fails to improve as anticipated.  I provided 20 minutes of non-face-to-face time during this encounter.  HISTORY/CURRENT STATUS: HPI for routine med check.  Has gotten more anxious. Cries easily. Wants to get back in the bed around lunch time. Doesn't enjoy anything.  Energy and motivation are low.  She still works from home.  The Xanax does help with the anxiety but not enough.  There are some days that she takes 4 of them but then she runs out early.  She feels panicky at times, has trouble breathing and gets sweaty.  Patient denies increased energy with decreased need for sleep, no increased talkativeness, no racing thoughts, no impulsivity or risky behaviors, no increased spending, no increased  libido, no grandiosity.  Denies dizziness, syncope, seizures, numbness, tingling, tremor, tics, unsteady gait, slurred speech, confusion. Denies muscle or joint pain, stiffness, or dystonia. Denies unexplained weight loss, frequent infections, or sores that heal slowly.  No polyphagia, polydipsia, or polyuria. Denies visual changes or paresthesias.   Individual Medical History/ Review of Systems: Changes? :No    Past medications for mental health diagnoses include: Sonata, Zoloft, Lunesta, Lexapro, Celexa, Prozac, Wellbutrin caused anger and irritability, Ambiencaused h/a and it's the only thing that helped her get her any sleep,Xanax, trazodone, Dayvigo caused horrible H/A and didn't work at all.  Abilify caused extreme wt gain, Mirtazepine cause wt gain, Vraylar  Seroquel caused tremor, Hydroxyzine caused tremor.  Allergies: Nitrofurantoin, Sulfonamide derivatives, and Wellbutrin [bupropion]  Current Medications:  Current Outpatient Medications:  .  ALPRAZolam (XANAX) 1 MG tablet, Take 1 tablet (1 mg total) by mouth 3 (three) times daily as needed for anxiety. Max 2.5 per day for now., Disp: 75 tablet, Rfl: 5 .  DULoxetine (CYMBALTA) 30 MG capsule, TAKE 1 CAPSULE (30 MG TOTAL) BY MOUTH DAILY. ADD TO 60 MG= 90 MG., Disp: 90 capsule, Rfl: 3 .  DULoxetine (CYMBALTA) 60 MG capsule, TAKE 1 CAPSULE BY MOUTH EVERY DAY IN THE MORNING, Disp: 90 capsule, Rfl: 0 .  zolpidem (AMBIEN) 10 MG tablet, Take 1 tablet (10 mg total) by mouth at bedtime as needed for sleep., Disp: 30 tablet, Rfl: 5 .  cariprazine (VRAYLAR) capsule, Take 1 capsule (3 mg total) by mouth daily., Disp: 30 capsule, Rfl: 1 .  omeprazole (PRILOSEC) 20 MG capsule, Take 2  capsules (40 mg total) by mouth daily. For 4 weeks (Patient not taking: Reported on 06/30/2019), Disp: 60 capsule, Rfl: 0 .  pantoprazole (PROTONIX) 40 MG tablet, Take by mouth daily as needed. , Disp: , Rfl:  .  penicillin v potassium (VEETID) 500 MG tablet, Take 1  tablet (500 mg total) by mouth 3 (three) times daily. (Patient not taking: Reported on 06/01/2018), Disp: 30 tablet, Rfl: 0 .  Vitamin D, Ergocalciferol, (DRISDOL) 1.25 MG (50000 UT) CAPS capsule, TAKE 1 CAPSULE BY MOUTH EVERY 7 (SEVEN) DAYS 12 WEEKS, THEN REPEAT LABS (Patient not taking: Reported on 11/17/2019), Disp: 12 capsule, Rfl: 0 Medication Side Effects: none  Family Medical/ Social History: Changes? No  MENTAL HEALTH EXAM:  There were no vitals taken for this visit.There is no height or weight on file to calculate BMI.  General Appearance: unable to assess  Eye Contact:  unable to assess  Speech:  Clear and Coherent  Volume:  Normal  Mood:  Anxious and Depressed  Affect:  Depressed, Tearful and Anxious  Thought Process:  Goal Directed and Descriptions of Associations: Intact  Orientation:  Full (Time, Place, and Person)  Thought Content: Logical   Suicidal Thoughts:  No  Homicidal Thoughts:  No  Memory:  WNL  Judgement:  Good  Insight:  Good  Psychomotor Activity:  unable to assess  Concentration:  Concentration: Good  Recall:  Good  Fund of Knowledge: Good  Language: Good  Assets:  Desire for Improvement  ADL's:  Intact  Cognition: WNL  Prognosis:  Good    DIAGNOSES:    ICD-10-CM   1. Generalized anxiety disorder  F41.1   2. Fatigue due to depression  F32.9    R53.83   3. Major depressive disorder, recurrent episode, moderate ([place])  F33.1     Receiving Psychotherapy: No    RECOMMENDATIONS:  PDMP was reviewed. I spent 20 minutes with her. I think she is depressed and anxious both, and options for treatment would be to increase the Cymbalta, or Vraylar, or add BuSpar.  We agreed to increase the Vraylar for now, I think it will help the anxiety and depression both.  She agrees. Continue Xanax 1 mg 1 p.o. 3 times daily as needed but try to keep this at the lowest dose possible.  Reminded her she is not to overtake the Xanax.  I will not refill it early. Increase  Vraylar 1.5 mg, to 2 po qd. and then give prescription for Vraylar 3 mg. Continue Cymbalta 30 mg +60 mg daily. Continue Ambien 10 mg nightly as needed sleep. Consider getting back in counseling.  Return in 4 weeks.   Donnal Moat, PA-C

## 2019-11-24 ENCOUNTER — Other Ambulatory Visit: Payer: Self-pay | Admitting: Physician Assistant

## 2019-12-16 ENCOUNTER — Telehealth: Payer: BC Managed Care – PPO | Admitting: Physician Assistant

## 2019-12-31 ENCOUNTER — Encounter: Payer: Self-pay | Admitting: Physician Assistant

## 2019-12-31 ENCOUNTER — Telehealth (INDEPENDENT_AMBULATORY_CARE_PROVIDER_SITE_OTHER): Payer: BC Managed Care – PPO | Admitting: Physician Assistant

## 2019-12-31 ENCOUNTER — Telehealth: Payer: Self-pay | Admitting: Physician Assistant

## 2019-12-31 DIAGNOSIS — F99 Mental disorder, not otherwise specified: Secondary | ICD-10-CM

## 2019-12-31 DIAGNOSIS — F339 Major depressive disorder, recurrent, unspecified: Secondary | ICD-10-CM | POA: Diagnosis not present

## 2019-12-31 DIAGNOSIS — F5105 Insomnia due to other mental disorder: Secondary | ICD-10-CM

## 2019-12-31 DIAGNOSIS — F4310 Post-traumatic stress disorder, unspecified: Secondary | ICD-10-CM

## 2019-12-31 DIAGNOSIS — F4001 Agoraphobia with panic disorder: Secondary | ICD-10-CM | POA: Diagnosis not present

## 2019-12-31 DIAGNOSIS — K117 Disturbances of salivary secretion: Secondary | ICD-10-CM

## 2019-12-31 MED ORDER — ALPRAZOLAM 1 MG PO TABS: 1.0000 mg | ORAL_TABLET | Freq: Three times a day (TID) | ORAL | 5 refills | Status: DC | PRN

## 2019-12-31 MED ORDER — ZOLPIDEM TARTRATE 10 MG PO TABS: 10.0000 mg | ORAL_TABLET | Freq: Every evening | ORAL | 5 refills | Status: DC | PRN

## 2019-12-31 MED ORDER — AMITRIPTYLINE HCL 25 MG PO TABS: ORAL_TABLET | ORAL | 0 refills | Status: DC

## 2019-12-31 NOTE — Telephone Encounter (Signed)
[name], alire are scheduled for a virtual visit with your provider today.    Just as we do with appointments in the office, we must obtain your consent to participate.  Your consent will be active for this visit and any virtual visit you may have with one of our providers in the next 365 days.    If you have a MyChart account, I can also send a copy of this consent to you electronically.  All virtual visits are billed to your insurance company just like a traditional visit in the office.  As this is a virtual visit, video technology does not allow for your provider to perform a traditional examination.  This may limit your provider's ability to fully assess your condition.  If your provider identifies any concerns that need to be evaluated in person or the need to arrange testing such as labs, EKG, etc, we will make arrangements to do so.    Although advances in technology are sophisticated, we cannot ensure that it will always work on either your end or our end.  If the connection with a video visit is poor, we may have to switch to a telephone visit.  With either a video or telephone visit, we are not always able to ensure that we have a secure connection.   I need to obtain your verbal consent now.   Are you willing to proceed with your visit today?   Isabella Martin has provided verbal consent on 12/31/2019 for a virtual visit (video or telephone).   [name], PA-C 12/31/2019  12:15 PM

## 2019-12-31 NOTE — Progress Notes (Signed)
[facility]  Patient ID: Isabella Martin,  MRN: [id number]  PCP: System, Provider Not In  Date of Evaluation: 12/31/2019 Time spent:40 minutes  Chief Complaint:  Chief Complaint    Depression; Anxiety     Virtual Visit via Telephone or Video Note  I connected with patient by a video enabled telemedicine application or telephone, with their informed consent, and verified patient privacy and that I am speaking with the correct person using two identifiers.  I am private, in my office and the patient is home work.  I discussed the limitations, risks, security and privacy concerns of performing an evaluation and management service by telephone and the availability of in person appointments. I also discussed with the patient that there may be a patient responsible charge related to this service. The patient expressed understanding and agreed to proceed.   I discussed the assessment and treatment plan with the patient. The patient was provided an opportunity to ask questions and all were answered. The patient agreed with the plan and demonstrated an understanding of the instructions.   The patient was advised to call back or seek an in-person evaluation if the symptoms worsen or if the condition fails to improve as anticipated.  I provided 40  minutes of non-face-to-face time during this encounter.  HISTORY/CURRENT STATUS: HPI for routine med check.  Patient states her mood has not been good.  She is sad all the time.  For the past 6 months it seems like she has been worse.  Does not want to do anything.  She stays home all the time.  She does not cook or clean or do laundry.  All she wants to do is sit around or laying in bed.  She has had some passive suicidal thoughts.  She does not have a plan but she does not want to keep feeling like this.  She feels hopeless.  She cries a lot, every day.  She went back to work yesterday after working from home due to [employer] for the past  year.  Said she cried all day.  At the last visit we increased the Vraylar to 3 mg.  Within the first day or 2, she had increased production of saliva.  Was drooling on herself.  She decreased the dose back to 1.5 mg and that resolved.  Is still very anxious but the Xanax does help.  She does get panicky sometimes, more specifically if she has to go out of her home.  If she takes an Ambien and the Xanax she is able to sleep okay but otherwise she is not able to.  Wants to sleep a lot.  She is able to get her work done for her job though.   Patient denies increased energy with decreased need for sleep, no increased talkativeness, no racing thoughts, no impulsivity or risky behaviors, no increased spending, no increased libido, no grandiosity.  Denies dizziness, syncope, seizures, numbness, tingling, tremor, tics, unsteady gait, slurred speech, confusion. Denies muscle or joint pain, stiffness, or dystonia. Denies unexplained weight loss, frequent infections, or sores that heal slowly.  No polyphagia, polydipsia, or polyuria. Denies visual changes or paresthesias.   Individual Medical History/ Review of Systems: Changes? :No    Past medications for mental health diagnoses include: Sonata, Zoloft, Lunesta, Lexapro, Celexa, Prozac, Wellbutrin caused anger and irritability, Ambiencaused h/a and it's the only thing that helped her get her any sleep,Xanax, trazodone, Dayvigo caused horrible H/A and didn't work at all.  Abilify caused  extreme wt gain, Mirtazepine cause wt gain, Vraylar  Seroquel caused tremor, Hydroxyzine caused tremor.  Allergies: Nitrofurantoin, Sulfonamide derivatives, and Wellbutrin [bupropion]  Current Medications:  Current Outpatient Medications:  .  ALPRAZolam (XANAX) 1 MG tablet, Take 1 tablet (1 mg total) by mouth 3 (three) times daily as needed for anxiety. Max 2.5 per day for now., Disp: 75 tablet, Rfl: 5 .  Etonogestrel (NEXPLANON [place]), Inject into the skin., Disp: , Rfl:   .  pantoprazole (PROTONIX) 40 MG tablet, Take by mouth., Disp: , Rfl:  .  zolpidem (AMBIEN) 10 MG tablet, Take 1 tablet (10 mg total) by mouth at bedtime as needed for sleep., Disp: 30 tablet, Rfl: 5 .  amitriptyline (ELAVIL) 25 MG tablet, 1 po qhs for 4 days, then 2 po qhs., Disp: 30 tablet, Rfl: 0 .  omeprazole (PRILOSEC) 20 MG capsule, Take 2 capsules (40 mg total) by mouth daily. For 4 weeks (Patient not taking: Reported on 06/30/2019), Disp: 60 capsule, Rfl: 0 .  pantoprazole (PROTONIX) 40 MG tablet, Take by mouth daily as needed. , Disp: , Rfl:  .  penicillin v potassium (VEETID) 500 MG tablet, Take 1 tablet (500 mg total) by mouth 3 (three) times daily. (Patient not taking: Reported on 06/01/2018), Disp: 30 tablet, Rfl: 0 .  Vitamin D, Ergocalciferol, (DRISDOL) 1.25 MG (50000 UT) CAPS capsule, TAKE 1 CAPSULE BY MOUTH EVERY 7 (SEVEN) DAYS 12 WEEKS, THEN REPEAT LABS (Patient not taking: Reported on 11/17/2019), Disp: 12 capsule, Rfl: 0 Medication Side Effects: Increase salivation secondary to higher dose of Vraylar  Family Medical/ Social History: Changes? No  MENTAL HEALTH EXAM:  There were no vitals taken for this visit.There is no height or weight on file to calculate BMI.  General Appearance: unable to assess  Eye Contact:  unable to assess  Speech:  Clear and Coherent  Volume:  Normal  Mood:  Anxious and Depressed  Affect:  Depressed, Tearful and Anxious  Thought Process:  Goal Directed and Descriptions of Associations: Intact  Orientation:  Full (Time, Place, and Person)  Thought Content: Logical   Suicidal Thoughts:  Yes.  without intent/plan  Homicidal Thoughts:  No  Memory:  WNL  Judgement:  Good  Insight:  Good  Psychomotor Activity:  unable to assess  Concentration:  Concentration: Good  Recall:  Good  Fund of Knowledge: Good  Language: Good  Assets:  Desire for Improvement  ADL's:  Intact  Cognition: WNL  Prognosis:  Good    DIAGNOSES:    ICD-10-CM   1.  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 PTSD (post-traumatic stress disorder)  F43.10   4. Insomnia due to other mental disorder  F51.05    F99   5. Salivation increase  K11.7     Receiving Psychotherapy: No    RECOMMENDATIONS:  PDMP was reviewed. Stop Vraylar. Wean off Cymbalta by taking 60 mg daily 4 days, then 30 mg 4 days then stop.  If she has any withdrawal symptoms such as dizziness, brain zaps, queasiness etc. she can twist the capsule open of the 30 mg pill and poor some of the contents on a teaspoon of applesauce or pudding or something like that, take 4 another 4 days or so and then stop that.  Call the office if there are any questions. Start Elavil 50 mg qhs.  Benefits, risks, side effects were discussed and she accepts. Continue Xanax 1  mg, 1 p.o. 3 times daily as needed, try to keep it at 2.5 mg/day as possible. Continue Ambien 10 mg nightly as needed sleep. If the suicidal thoughts continue or worsen in any way, she should call our office, go to [facility] emergency room or call 911.  Contract for safety is in place. Strongly recommend counseling.  Return in 4 weeks.   [name], PA-C

## 2020-01-14 ENCOUNTER — Telehealth: Payer: Self-pay | Admitting: Physician Assistant

## 2020-01-14 ENCOUNTER — Other Ambulatory Visit: Payer: Self-pay

## 2020-01-14 MED ORDER — AMITRIPTYLINE HCL 25 MG PO TABS: ORAL_TABLET | ORAL | 0 refills | Status: DC

## 2020-01-14 NOTE — Telephone Encounter (Signed)
Pt called to report Amitriptyline 25 mg 2 @ night bed time is working well asking for refill [facility]. Apt 7/13

## 2020-01-14 NOTE — Telephone Encounter (Signed)
Updated Rx sent

## 2020-01-16 ENCOUNTER — Other Ambulatory Visit: Payer: Self-pay | Admitting: Physician Assistant

## 2020-02-01 ENCOUNTER — Ambulatory Visit (INDEPENDENT_AMBULATORY_CARE_PROVIDER_SITE_OTHER): Payer: BC Managed Care – PPO | Admitting: Physician Assistant

## 2020-02-01 ENCOUNTER — Encounter: Payer: Self-pay | Admitting: Physician Assistant

## 2020-02-01 ENCOUNTER — Other Ambulatory Visit: Payer: Self-pay

## 2020-02-01 DIAGNOSIS — F411 Generalized anxiety disorder: Secondary | ICD-10-CM | POA: Diagnosis not present

## 2020-02-01 DIAGNOSIS — F4310 Post-traumatic stress disorder, unspecified: Secondary | ICD-10-CM

## 2020-02-01 DIAGNOSIS — G4700 Insomnia, unspecified: Secondary | ICD-10-CM

## 2020-02-01 DIAGNOSIS — K117 Disturbances of salivary secretion: Secondary | ICD-10-CM

## 2020-02-01 DIAGNOSIS — F339 Major depressive disorder, recurrent, unspecified: Secondary | ICD-10-CM

## 2020-02-01 NOTE — Progress Notes (Signed)
[facility]  Patient ID: Isabella Martin,  MRN: [id number]  PCP: System, Provider Not In  Date of Evaluation: 02/01/2020 Time spent:30 minutes  Chief Complaint:  Chief Complaint    Depression; Anxiety       HISTORY/CURRENT STATUS: HPI for routine med check.  At the last visit, we started amitriptyline.  States she is about 80% better!  She is more able to enjoy things, energy and motivation are much better.  She is back at work now, not sitting and looking at the same four walls as she has been for over a year due to COVID.  She does not cry easily.  Her appetite has increased since starting on the amitriptyline.  She is sleeping much better.  Denies suicidal or homicidal thoughts.  Complains of severe dry mouth and constipation.  She has taken MiraLAX once and took Dulcolax 1 time.  Does or only minimally beneficial.  Hope she does not have to go off of the amitriptyline because it has been so very helpful.  Patient denies increased energy with decreased need for sleep, no increased talkativeness, no racing thoughts, no impulsivity or risky behaviors, no increased spending, no increased libido, no grandiosity, no increased irritability or anger, and no hallucinations.  Anxiety is well controlled with the Xanax.  Feeling better on the amitriptyline has helped with the anxiety as well.  Denies dizziness, syncope, seizures, numbness, tingling, tremor, tics, unsteady gait, slurred speech, confusion. Denies muscle or joint pain, stiffness, or dystonia. Denies unexplained weight loss, frequent infections, or sores that heal slowly.  No polyphagia, polydipsia, or polyuria. Denies visual changes or paresthesias.   Individual Medical History/ Review of Systems: Changes? :No    Past medications for mental health diagnoses include: Sonata, Zoloft, Lunesta, Lexapro, Celexa, Prozac, Wellbutrin caused anger and irritability, Ambiencaused h/a and it's the only thing that helped her  get her any sleep,Xanax, trazodone, Dayvigo caused horrible H/A and didn't work at all.  Abilify caused extreme wt gain, Mirtazepine cause wt gain, Vraylar  Seroquel caused tremor, Hydroxyzine caused tremor.  Allergies: Nitrofurantoin, Sulfonamide derivatives, and Wellbutrin [bupropion]  Current Medications:  Current Outpatient Medications:  .  ALPRAZolam (XANAX) 1 MG tablet, Take 1 tablet (1 mg total) by mouth 3 (three) times daily as needed for anxiety. Max 2.5 per day for now., Disp: 75 tablet, Rfl: 5 .  amitriptyline (ELAVIL) 25 MG tablet, take 2 tablets po qhs., Disp: 60 tablet, Rfl: 0 .  Etonogestrel (NEXPLANON [place]), Inject into the skin., Disp: , Rfl:  .  pantoprazole (PROTONIX) 40 MG tablet, Take by mouth., Disp: , Rfl:  .  zolpidem (AMBIEN) 10 MG tablet, Take 1 tablet (10 mg total) by mouth at bedtime as needed for sleep., Disp: 30 tablet, Rfl: 5 .  omeprazole (PRILOSEC) 20 MG capsule, Take 2 capsules (40 mg total) by mouth daily. For 4 weeks (Patient not taking: Reported on 06/30/2019), Disp: 60 capsule, Rfl: 0 .  pantoprazole (PROTONIX) 40 MG tablet, Take by mouth daily as needed. , Disp: , Rfl:  .  penicillin v potassium (VEETID) 500 MG tablet, Take 1 tablet (500 mg total) by mouth 3 (three) times daily. (Patient not taking: Reported on 06/01/2018), Disp: 30 tablet, Rfl: 0 .  Vitamin D, Ergocalciferol, (DRISDOL) 1.25 MG (50000 UT) CAPS capsule, TAKE 1 CAPSULE BY MOUTH EVERY 7 (SEVEN) DAYS 12 WEEKS, THEN REPEAT LABS (Patient not taking: Reported on 11/17/2019), Disp: 12 capsule, Rfl: 0 Medication Side Effects: constipation, dry mouth and Increased hunger  Family Medical/ Social History: Changes? No  MENTAL HEALTH EXAM:  There were no vitals taken for this visit.There is no height or weight on file to calculate BMI.  General Appearance: Casual, Neat, Well Groomed and Obese  Eye Contact:  Good  Speech:  Clear and Coherent  Volume:  Normal  Mood:  Euthymic  Affect:  Appropriate   Thought Process:  Goal Directed and Descriptions of Associations: Intact  Orientation:  Full (Time, Place, and Person)  Thought Content: Logical   Suicidal Thoughts:  No  Homicidal Thoughts:  No  Memory:  WNL  Judgement:  Good  Insight:  Good  Psychomotor Activity:  Normal  Concentration:  Concentration: Good and Attention Span: Good  Recall:  Good  Fund of Knowledge: Good  Language: Good  Assets:  Desire for Improvement  ADL's:  Intact  Cognition: WNL  Prognosis:  Good    DIAGNOSES:    ICD-10-CM   1. 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 PTSD (post-traumatic stress disorder)  F43.10   4. Generalized anxiety disorder  F41.1   5. Insomnia, unspecified type  G47.00     Receiving Psychotherapy: No    RECOMMENDATIONS:  PDMP was reviewed. I provided 30 minutes face-to-face time during this encounter. I am glad to see her doing so well! As far as the side effects of constipation and dry mouth go, I recommend increasing fluids, she can use gums or hard candy that have sorbitol in them, we may have to resort to pilocarpine.  For the constipation, use MiraLAX as needed, Senokot, Metamucil, increase fiber in diet. We decided to decrease the amitriptyline to see if that would help with the side effects.  If it does not and her mood goes down, then will increase back. I may need to prescribe Salagen, but hopefully we will have to add another medication. Decrease Elavil 25 mg, 1 p.o. nightly. Continue Xanax 1 mg, 1 p.o. 3 times daily as needed, try to keep it at no more than 2.5 mg daily. Continue Ambien 10 mg, 1 p.o. nightly as needed sleep. Return in 6 weeks.  [name], PA-C

## 2020-02-08 ENCOUNTER — Other Ambulatory Visit: Payer: Self-pay | Admitting: Physician Assistant

## 2020-02-22 ENCOUNTER — Telehealth: Payer: Self-pay | Admitting: Physician Assistant

## 2020-02-22 NOTE — Telephone Encounter (Signed)
Patient was advised at last office visit on 02/01/2020 to try Miralax for her constipation since starting amitriptyline. She has been miserable and has had 2 ER visits due to severe constipation. Patient has stopped amitriptyline although it was working the GI issues are unbearable.   Informed her I would update Rosey Bath and follow up soon.

## 2020-02-22 NOTE — Telephone Encounter (Signed)
Pt left a messaging stating that she wanted to talk to the nurse about some issues that she is having with the medications. Please call her back at [phone]

## 2020-02-22 NOTE — Telephone Encounter (Signed)
Pt called back to talk with nurse about Amitriptyline she started last.  She has issues and had to go to hospital 2 times this past weekend. [phone]

## 2020-02-23 ENCOUNTER — Other Ambulatory Visit: Payer: Self-pay | Admitting: Physician Assistant

## 2020-02-23 MED ORDER — ESCITALOPRAM OXALATE 10 MG PO TABS
10.0000 mg | ORAL_TABLET | Freq: Every day | ORAL | 1 refills | Status: DC
Start: 2020-02-23 — End: 2020-03-15

## 2020-02-23 NOTE — Telephone Encounter (Signed)
Returned patient's call this morning after I reviewed her ER records.  She has had severe constipation that started after using the amitriptyline, only at 25 mg.  This drug has been more beneficial than anything else in the past 10 years but she has had to stop it.  She discontinued it on 02/20/2020, was given 2 enemas in the ER which helped but states her colon was not completely emptied.  The ER doctor told her to get a bottle of mag citrate and drink it this coming up weekend when she has the time to go to the bathroom.  She is still having a lot of distention and GI discomfort.  She is using MiraLAX and stool softeners since the last visit to the ER and it still not helping.  She is unable to get off work so that she could go ahead and use the mag citrate.  She asked if she could go back on Lexapro as that was beneficial before she had her child.  It has been around 10 years since she has been on it.  I think it is fine to go back on it but I wonder if now is the right time since she is already having problems.  It can also cause GI symptoms when first starting it.  She prefers to go ahead and try it.  "Scared to not being on any antidepressant.  We will go ahead and start the Lexapro 10 mg daily.  I sent in the prescription to her pharmacy.  If she is still having problems with constipation next week after using the mag citrate this coming weekend, I want her to contact her GI.  The constipation could be caused from something other than only the amitriptyline.  She has an appointment to follow-up with me in about 3 to 4 weeks.

## 2020-03-15 ENCOUNTER — Other Ambulatory Visit: Payer: Self-pay

## 2020-03-15 ENCOUNTER — Encounter: Payer: Self-pay | Admitting: Physician Assistant

## 2020-03-15 ENCOUNTER — Ambulatory Visit (INDEPENDENT_AMBULATORY_CARE_PROVIDER_SITE_OTHER): Payer: BC Managed Care – PPO | Admitting: Physician Assistant

## 2020-03-15 DIAGNOSIS — F411 Generalized anxiety disorder: Secondary | ICD-10-CM

## 2020-03-15 DIAGNOSIS — F5105 Insomnia due to other mental disorder: Secondary | ICD-10-CM

## 2020-03-15 DIAGNOSIS — F339 Major depressive disorder, recurrent, unspecified: Secondary | ICD-10-CM | POA: Diagnosis not present

## 2020-03-15 DIAGNOSIS — K5900 Constipation, unspecified: Secondary | ICD-10-CM

## 2020-03-15 DIAGNOSIS — F99 Mental disorder, not otherwise specified: Secondary | ICD-10-CM

## 2020-03-15 MED ORDER — ESCITALOPRAM OXALATE 10 MG PO TABS: 15.0000 mg | ORAL_TABLET | Freq: Every day | ORAL | 1 refills | Status: DC

## 2020-03-15 NOTE — Progress Notes (Signed)
[facility]  Patient ID: Isabella Martin,  MRN: [id number]  PCP: System, Provider Not In  Date of Evaluation: 03/15/2020 Time spent:40 minutes  Chief Complaint:  Chief Complaint    Anxiety; Depression; Insomnia; Follow-up       HISTORY/CURRENT STATUS:  Since Isabella Martin was here the last time, she had severe constipation, requiring 2 visits to the emergency room, 2 enemas, a full bottle of MiraLAX and Gatorade, and a bottle of mag citrate.  The constipation did not start until we added amitriptyline.  Of course that was discontinued.  Patient states that has worked better than any medication she is ever been on and she hates it that she cannot take it.  But the constipation was horrible and she cannot go through that again.  Her bowel movements are back to normal now.  She was started back on Lexapro approximately 3 weeks ago.  She took this around 10 years ago when she first started dealing with depression.  It helped a lot then but it pooped out at some point.  Now that she has been back on it for 3 weeks, she can tell that she is some better, maybe about 50% with energy and motivation, then she was before starting it.  She is not isolating.  She is able to enjoy some things.  She does not cry easily now.  She is still working from home.  She sleeps well most of the time, with the Ambien.  No suicidal or homicidal thoughts.  Patient denies increased energy with decreased need for sleep, no increased talkativeness, no racing thoughts, no impulsivity or risky behaviors, no increased spending, no increased libido, no grandiosity, no increased irritability or anger, no paranoia, and no hallucinations.  Anxiety is well controlled with the Xanax.  The anxiety is not so much panic attacks as it is generalized anxiety when it does occur.  Xanax keeps her calm so that she has better quality of life.   Denies dizziness, syncope, seizures, numbness, tingling, tremor, tics, unsteady  gait, slurred speech, confusion. Denies muscle or joint pain, stiffness, or dystonia. Denies unexplained weight loss, frequent infections, or sores that heal slowly.  No polyphagia, polydipsia, or polyuria. Denies visual changes or paresthesias.   Individual Medical History/ Review of Systems: Changes? :No    Past medications for mental health diagnoses include: Sonata, Zoloft, Lunesta, Lexapro, Celexa, Prozac, Wellbutrin caused anger and irritability, Ambiencaused h/a and it's the only thing that helped her get her any sleep,Xanax, trazodone, Dayvigo caused horrible H/A and didn't work at all.  Abilify caused extreme wt gain, Mirtazepine cause wt gain, Vraylar, amytriptylline caused obstipation requiring 2 ER visits, 2 enemas MagCitrate and whole bottle of Miralax,  Seroquel caused tremor, Hydroxyzine caused tremor.  Allergies: Elavil [amitriptyline], Nitrofurantoin, Sulfonamide derivatives, and Wellbutrin [bupropion]  Current Medications:  Current Outpatient Medications:  .  ALPRAZolam (XANAX) 1 MG tablet, Take 1 tablet (1 mg total) by mouth 3 (three) times daily as needed for anxiety. Max 2.5 per day for now., Disp: 75 tablet, Rfl: 5 .  escitalopram (LEXAPRO) 10 MG tablet, Take 1.5 tablets (15 mg total) by mouth daily., Disp: 45 tablet, Rfl: 1 .  Etonogestrel (NEXPLANON [place]), Inject into the skin., Disp: , Rfl:  .  pantoprazole (PROTONIX) 40 MG tablet, Take by mouth., Disp: , Rfl:  .  zolpidem (AMBIEN) 10 MG tablet, Take 1 tablet (10 mg total) by mouth at bedtime as needed for sleep., Disp: 30 tablet, Rfl: 5 .  omeprazole (  PRILOSEC) 20 MG capsule, Take 2 capsules (40 mg total) by mouth daily. For 4 weeks (Patient not taking: Reported on 06/30/2019), Disp: 60 capsule, Rfl: 0 .  pantoprazole (PROTONIX) 40 MG tablet, Take by mouth daily as needed. , Disp: , Rfl:  .  penicillin v potassium (VEETID) 500 MG tablet, Take 1 tablet (500 mg total) by mouth 3 (three) times daily. (Patient not taking:  Reported on 06/01/2018), Disp: 30 tablet, Rfl: 0 .  Vitamin D, Ergocalciferol, (DRISDOL) 1.25 MG (50000 UT) CAPS capsule, TAKE 1 CAPSULE BY MOUTH EVERY 7 (SEVEN) DAYS 12 WEEKS, THEN REPEAT LABS (Patient not taking: Reported on 11/17/2019), Disp: 12 capsule, Rfl: 0 Medication Side Effects: Severe constipation with the amitriptyline.  That has since been discontinued.  Family Medical/ Social History: Changes? No  MENTAL HEALTH EXAM:  There were no vitals taken for this visit.There is no height or weight on file to calculate BMI.  General Appearance: Casual, Neat, Well Groomed and Obese  Eye Contact:  Good  Speech:  Clear and Coherent  Volume:  Normal  Mood:  Euthymic  Affect:  Appropriate  Thought Process:  Goal Directed and Descriptions of Associations: Intact  Orientation:  Full (Time, Place, and Person)  Thought Content: Logical   Suicidal Thoughts:  No  Homicidal Thoughts:  No  Memory:  WNL  Judgement:  Good  Insight:  Good  Psychomotor Activity:  Normal  Concentration:  Concentration: Good and Attention Span: Good  Recall:  Good  Fund of Knowledge: Good  Language: Good  Assets:  Desire for Improvement  ADL's:  Intact  Cognition: WNL  Prognosis:  Good    DIAGNOSES:    ICD-10-CM   1. 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 Generalized anxiety disorder  F41.1   3. Insomnia due to other mental disorder  F51.05    F99   4. Obstipation  K59.00     Receiving Psychotherapy: No    RECOMMENDATIONS:  PDMP was reviewed. I provided 40 minutes face-to-face time during this encounter including review of ER notes. Discontinue Elavil, which is already been done approximately 1 month ago. We discussed her symptoms and I believe increasing the Lexapro would be beneficial.  Even though she is only been on it for 3 weeks, I feel like she will need to have a higher dose.  She understands and would like to try it. Increase Lexapro to 10 mg, 1.5 pills daily.    Continue Xanax 1 mg, 1 p.o. 3 times daily as needed, try to keep it at no more than 2.5 mg daily. Continue Ambien 10 mg, 1 p.o. nightly as needed sleep. Return in 6 weeks.  Isabella Overly, PA-C

## 2020-04-06 ENCOUNTER — Other Ambulatory Visit: Payer: Self-pay | Admitting: Physician Assistant

## 2020-04-06 NOTE — Telephone Encounter (Signed)
review 

## 2020-04-25 ENCOUNTER — Encounter: Payer: Self-pay | Admitting: Physician Assistant

## 2020-04-25 ENCOUNTER — Ambulatory Visit (INDEPENDENT_AMBULATORY_CARE_PROVIDER_SITE_OTHER): Payer: BC Managed Care – PPO | Admitting: Physician Assistant

## 2020-04-25 ENCOUNTER — Other Ambulatory Visit: Payer: Self-pay

## 2020-04-25 DIAGNOSIS — F5105 Insomnia due to other mental disorder: Secondary | ICD-10-CM

## 2020-04-25 DIAGNOSIS — F411 Generalized anxiety disorder: Secondary | ICD-10-CM | POA: Diagnosis not present

## 2020-04-25 DIAGNOSIS — F339 Major depressive disorder, recurrent, unspecified: Secondary | ICD-10-CM

## 2020-04-25 DIAGNOSIS — F99 Mental disorder, not otherwise specified: Secondary | ICD-10-CM | POA: Diagnosis not present

## 2020-04-25 MED ORDER — ALPRAZOLAM 1 MG PO TABS: 1.0000 mg | ORAL_TABLET | Freq: Three times a day (TID) | ORAL | 5 refills | Status: DC | PRN

## 2020-04-25 NOTE — Progress Notes (Signed)
[facility]  Patient ID: Isabella Martin,  MRN: [id number]  PCP: System, Provider Not In  Date of Evaluation: 04/25/2020 Time spent:20 minutes  Chief Complaint:  Chief Complaint    Depression; Anxiety; Insomnia      HISTORY/CURRENT STATUS: For routine med check.  Has been doing some better since we added the Lexapro. States she still has some 'down' days but the Lexapro has helped a lot. Able to enjoy things more, like went to [employer], and [place] week.  Energy and motivation are better. Work is going well. Not isolating. Not crying all the time. No SI/HI.   Patient denies increased energy with decreased need for sleep, no increased talkativeness, no racing thoughts, no impulsivity or risky behaviors, no increased spending, no increased libido, no grandiosity, no increased irritability or anger, no paranoia, and no hallucinations.  Anxiety is still an issue, especially in the evenings. The Xanax does help, but needs it three times a day, but then she runs out early.    Denies dizziness, syncope, seizures, numbness, tingling, tremor, tics, unsteady gait, slurred speech, confusion. Denies muscle or joint pain, stiffness, or dystonia. Denies unexplained weight loss, frequent infections, or sores that heal slowly.  No polyphagia, polydipsia, or polyuria. Denies visual changes or paresthesias.   Individual Medical History/ Review of Systems: Changes? :No    Past medications for mental health diagnoses include: Sonata, Zoloft, Lunesta, Lexapro, Celexa, Prozac, Wellbutrin caused anger and irritability, Ambiencaused h/a and it's the only thing that helped her get her any sleep,Xanax, trazodone, Dayvigo caused horrible H/A and didn't work at all.  Abilify caused extreme wt gain, Mirtazepine cause wt gain, Vraylar, amytriptylline caused obstipation requiring 2 ER visits, 2 enemas MagCitrate and whole bottle of Miralax,  Seroquel caused tremor, Hydroxyzine caused  tremor.  Allergies: Elavil [amitriptyline], Nitrofurantoin, Sulfonamide derivatives, and Wellbutrin [bupropion]  Current Medications:  Current Outpatient Medications:    ALPRAZolam (XANAX) 1 MG tablet, Take 1 tablet (1 mg total) by mouth 3 (three) times daily as needed for anxiety., Disp: 90 tablet, Rfl: 5   escitalopram (LEXAPRO) 10 MG tablet, TAKE 1 AND 1/2 TABLETS DAILY BY MOUTH, Disp: 135 tablet, Rfl: 1   Etonogestrel (NEXPLANON [place]), Inject into the skin., Disp: , Rfl:    pantoprazole (PROTONIX) 40 MG tablet, Take by mouth., Disp: , Rfl:    zolpidem (AMBIEN) 10 MG tablet, Take 1 tablet (10 mg total) by mouth at bedtime as needed for sleep., Disp: 30 tablet, Rfl: 5   omeprazole (PRILOSEC) 20 MG capsule, Take 2 capsules (40 mg total) by mouth daily. For 4 weeks (Patient not taking: Reported on 06/30/2019), Disp: 60 capsule, Rfl: 0   pantoprazole (PROTONIX) 40 MG tablet, Take by mouth daily as needed. , Disp: , Rfl:    penicillin v potassium (VEETID) 500 MG tablet, Take 1 tablet (500 mg total) by mouth 3 (three) times daily. (Patient not taking: Reported on 06/01/2018), Disp: 30 tablet, Rfl: 0   Vitamin D, Ergocalciferol, (DRISDOL) 1.25 MG (50000 UT) CAPS capsule, TAKE 1 CAPSULE BY MOUTH EVERY 7 (SEVEN) DAYS 12 WEEKS, THEN REPEAT LABS (Patient not taking: Reported on 11/17/2019), Disp: 12 capsule, Rfl: 0 Medication Side Effects: none  Family Medical/ Social History: Changes? No  MENTAL HEALTH EXAM:  There were no vitals taken for this visit.There is no height or weight on file to calculate BMI.  General Appearance: Casual, Neat, Well Groomed and Obese  Eye Contact:  Good  Speech:  Clear and Coherent  Volume:  Normal  Mood:  Euthymic  Affect:  Appropriate  Thought Process:  Goal Directed and Descriptions of Associations: Intact  Orientation:  Full (Time, Place, and Person)  Thought Content: Logical   Suicidal Thoughts:  No  Homicidal Thoughts:  No  Memory:  WNL  Judgement:   Good  Insight:  Good  Psychomotor Activity:  Normal  Concentration:  Concentration: Good and Attention Span: Good  Recall:  Good  Fund of Knowledge: Good  Language: Good  Assets:  Desire for Improvement  ADL's:  Intact  Cognition: WNL  Prognosis:  Good    DIAGNOSES:    ICD-10-CM   1. Generalized anxiety disorder  F41.1   2. 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 Insomnia due to other mental disorder  F51.05    F99     Receiving Psychotherapy: No    RECOMMENDATIONS:  PDMP was reviewed. I provided 20 minutes face-to-face time during this encounter. We discussed the anxiety.  I will increase the Xanax a little bit higher but she is taking a pretty high dose.  I will not continue to increase it but need to find another way to help prevent and treat the anxiety if this proves to be ineffective.  She verbalizes understanding. Continue Lexapro 10 mg, 1.5 pills daily.   Continue Xanax 1 mg, 1 p.o. 3 times daily as needed. Continue Ambien 10 mg, 1 p.o. nightly as needed sleep. Return in 3 months.  [name], PA-C

## 2020-05-02 ENCOUNTER — Telehealth: Payer: Self-pay | Admitting: Physician Assistant

## 2020-05-02 ENCOUNTER — Other Ambulatory Visit: Payer: Self-pay | Admitting: Physician Assistant

## 2020-05-02 NOTE — Telephone Encounter (Signed)
Pt called and said her Alprazolam had been put in for refills. She is out now because she has run out of refills now. The script was written for 90 pills with 5 refills. She said the old refill was to be called in on the 17th and now that the new dosage was called in and it extended it four days. She needs another refill called in to [facility] on [address] [phone]

## 2020-05-02 NOTE — Telephone Encounter (Signed)
Clarification on message, she is not out of her medication yet. She will take her last dose on Friday. The pharmacy says they can not fill until Tuesday the 19 th. She said she will be a mess if she has to go 4 days without any. Informed her I would update Rosey Bath with information and get back with her tomorrow.

## 2020-05-02 NOTE — Telephone Encounter (Signed)
Please review,

## 2020-05-02 NOTE — Telephone Encounter (Signed)
Please give her a call.  I reviewed her chart and according to the [facility] registry she picked up Xanax on 04/17/2020 for 75 pills.  Even if she took 1 3 times a day she should not be out by now.  She should have around 17 pills left.  She cannot get it filled yet.  It will not be due until the 17th.

## 2020-05-03 NOTE — Telephone Encounter (Signed)
Thanks for the clarification.  Please call her pharmacy, have them filled the prescription on Saturday the 16th, since I have increased the daily dose, she will run out earlier.

## 2020-05-03 NOTE — Telephone Encounter (Signed)
Patient is aware it can be filled on Saturday, will contact pharmacist to allow the refill on 16th.

## 2020-05-04 ENCOUNTER — Other Ambulatory Visit: Payer: Self-pay | Admitting: Physician Assistant

## 2020-05-04 NOTE — Telephone Encounter (Signed)
Pt called back and left a message stating that the pharmacy had not heard from the nurse about her prescription. She is requesting a call back at [phone]

## 2020-05-05 NOTE — Telephone Encounter (Signed)
Spoke with pharmacist this morning and they actually have already filled the Rx and is waiting for her to pick up. They filled it yesterday and patient was already aware.

## 2020-05-08 ENCOUNTER — Other Ambulatory Visit: Payer: Self-pay | Admitting: Physician Assistant

## 2020-06-16 ENCOUNTER — Emergency Department (INDEPENDENT_AMBULATORY_CARE_PROVIDER_SITE_OTHER)
Admission: EM | Admit: 2020-06-16 | Discharge: 2020-06-16 | Disposition: A | Discharge status: Home / Self Care | Payer: BC Managed Care – PPO | Source: Home / Self Care

## 2020-06-16 ENCOUNTER — Other Ambulatory Visit: Payer: Self-pay

## 2020-06-16 DIAGNOSIS — M546 Pain in thoracic spine: Secondary | ICD-10-CM | POA: Diagnosis not present

## 2020-06-16 MED ORDER — DEXAMETHASONE SODIUM PHOSPHATE 10 MG/ML IJ SOLN
10.0000 mg | Freq: Once | INTRAMUSCULAR | Status: AC
  Administered 2020-06-16: 10 mg via INTRAMUSCULAR

## 2020-06-16 MED ORDER — TIZANIDINE HCL 4 MG PO TABS: 4.0000 mg | ORAL_TABLET | Freq: Four times a day (QID) | ORAL | 0 refills | Status: DC | PRN

## 2020-06-16 MED ORDER — KETOROLAC TROMETHAMINE 60 MG/2ML IM SOLN
60.0000 mg | Freq: Once | INTRAMUSCULAR | Status: AC
  Administered 2020-06-16: 60 mg via INTRAMUSCULAR

## 2020-06-16 NOTE — ED Provider Notes (Signed)
[facility]    CSN: [id number] Arrival date & time: 06/16/20  1215      History   Chief Complaint Chief Complaint  Patient presents with  . Back Pain    HPI Isabella Martin is a 39 y.o. female.   HPI Patient presents with thoracic back pain mid right sided. Pain is localized. Non radiating. She has not taken anything for pain. She reports seeing a chiropractor who referred her here for pain medication.  She reports unable to tolerate ibuprofen and has not tried to tylenol. .  Past Medical History:  Diagnosis Date  . Depression     Patient Active Problem List   Diagnosis Date Noted  . Impaired fasting glucose 01/05/2019  . Hypertriglyceridemia 01/04/2019  . GERD (gastroesophageal reflux disease) 10/09/2018  . MDD (major depressive disorder) 05/18/2018  . GAD (generalized anxiety disorder) 05/18/2018  . Insomnia 05/18/2018  . Posttraumatic stress disorder 05/01/2018  . STREPTOCOCCAL SORE THROAT 05/06/2011    Past Surgical History:  Procedure Laterality Date  . CHOLECYSTECTOMY, LAPAROSCOPIC    . Double Syicter Plasty      OB History   No obstetric history on file.      Home Medications    Prior to Admission medications   Medication Sig Start Date End Date Taking? Authorizing Provider  ALPRAZolam Prudy Feeler) 1 MG tablet Take 1 tablet (1 mg total) by mouth 3 (three) times daily as needed for anxiety. 04/25/20   [name] T, PA-C  escitalopram (LEXAPRO) 10 MG tablet TAKE 1 AND 1/2 TABLETS DAILY BY MOUTH 04/06/20   Hurst, Rosey Bath T, PA-C  Etonogestrel (NEXPLANON [place]) Inject into the skin.    [provider]  pantoprazole (PROTONIX) 40 MG tablet Take by mouth daily as needed.  07/27/18 07/27/19  [provider]  pantoprazole (PROTONIX) 40 MG tablet Take by mouth. 12/25/18   [provider]  Vitamin D, Ergocalciferol, (DRISDOL) 1.25 MG (50000 UT) CAPS capsule TAKE 1 CAPSULE BY MOUTH EVERY 7 (SEVEN) DAYS 12 WEEKS, THEN REPEAT  LABS Patient not taking: Reported on 11/17/2019 07/27/19   [name] T, PA-C  zolpidem (AMBIEN) 10 MG tablet Take 1 tablet (10 mg total) by mouth at bedtime as needed for sleep. 12/31/19   Cherie Ouch, PA-C    Family History Family History  Problem Relation Age of Onset  . Cancer Father   . Healthy Mother     Social History Social History   Tobacco Use  . Smoking status: Current Every Day Smoker    Packs/day: 0.50    Types: Cigarettes  . Smokeless tobacco: Never Used  Substance Use Topics  . Alcohol use: Not Currently    Comment: 1 drink a month  . Drug use: No     Allergies   Elavil [amitriptyline], Nitrofurantoin, Sulfonamide derivatives, and Wellbutrin [bupropion]   Review of Systems Review of Systems Pertinent negatives listed in HPI  Physical Exam Triage Vital Signs ED Triage Vitals  Enc Vitals Group     BP 06/16/20 1437 106/75     Pulse Rate 06/16/20 1437 86     Resp 06/16/20 1437 18     Temp 06/16/20 1437 98.8 F (37.1 C)     Temp Source 06/16/20 1437 Oral     SpO2 06/16/20 1437 100 %     Weight --      Height --      Head Circumference --      Peak Flow --  Pain Score 06/16/20 1432 8     Pain Loc --      Pain Edu? --      Excl. in GC? --    No data found.  Updated Vital Signs BP 106/75 (BP Location: Left Arm)   Pulse 86   Temp 98.8 F (37.1 C) (Oral)   Resp 18   SpO2 100%   Visual Acuity Right Eye Distance:   Left Eye Distance:   Bilateral Distance:    Right Eye Near:   Left Eye Near:    Bilateral Near:     Physical Exam Vitals reviewed.  Constitutional:      Appearance: Normal appearance.  Cardiovascular:     Rate and Rhythm: Normal rate and regular rhythm.  Pulmonary:     Effort: Pulmonary effort is normal.     Breath sounds: Normal breath sounds.  Musculoskeletal:       Arms:     Cervical back: Normal range of motion and neck supple.  Skin:    General: Skin is warm.     Capillary Refill: Capillary refill takes  less than 2 seconds.  Neurological:     Mental Status: She is alert.  Psychiatric:        Mood and Affect: Mood is anxious.        Speech: Speech normal.        Cognition and Memory: Cognition and memory normal.      UC Treatments / Results  Labs (all labs ordered are listed, but only abnormal results are displayed) Labs Reviewed - No data to display  EKG   Radiology No results found.  Procedures Procedures (including critical care time)  Medications Ordered in UC Medications - No data to display  Initial Impression / Assessment and Plan / UC Course  I have reviewed the triage vital signs and the nursing notes.  Pertinent labs & imaging results that were available during my care of the patient were reviewed by me and considered in my medical decision making (see chart for details).    Acute thoracic back pain, patient requested controlled pain medication. Given exam finding pain is localized not impairing ADL. Advised controlled pain medication is not indicated in management of back pain. Advised to trial Tizinidine and tylenol. Toradol IM and Decadron IM given today. Follow-up with PCP Final Clinical Impressions(s) / UC Diagnoses   Final diagnoses:  Acute midline thoracic back pain   Discharge Instructions   None    ED Prescriptions    Medication Sig Dispense Auth. Provider   tiZANidine (ZANAFLEX) 4 MG tablet Take 1 tablet (4 mg total) by mouth every 6 (six) hours as needed for muscle spasms. 30 tablet Bing Neighbors, FNP     PDMP not reviewed this encounter.   Bing Neighbors, FNP 06/20/20 1029

## 2020-06-16 NOTE — ED Triage Notes (Addendum)
Pt presents to Urgent Care with c/o back pain (middle, between shoulder blades) x 3 weeks. Pt reports going to the chiropractor today and states he told her that her problem doesn't appear to be muscle-related and that she should come to UC for Rx pain medication. She denies numbness and tingling anywhere. She has been taking Tylenol for pain w/o relief; cannot take ibuprofen d/t gastritis.

## 2020-07-01 ENCOUNTER — Other Ambulatory Visit: Payer: Self-pay | Admitting: Physician Assistant

## 2020-07-11 DIAGNOSIS — K580 Irritable bowel syndrome with diarrhea: Secondary | ICD-10-CM | POA: Insufficient documentation

## 2020-07-18 ENCOUNTER — Telehealth: Payer: Self-pay | Admitting: Physician Assistant

## 2020-07-18 MED ORDER — HYDROXYZINE HCL 25 MG PO TABS: 25.0000 mg | ORAL_TABLET | Freq: Three times a day (TID) | ORAL | 0 refills | Status: DC | PRN

## 2020-07-18 MED ORDER — ESCITALOPRAM OXALATE 10 MG PO TABS: 20.0000 mg | ORAL_TABLET | Freq: Every day | ORAL | 1 refills | Status: DC
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
## 2020-07-19 ENCOUNTER — Other Ambulatory Visit: Payer: Self-pay | Admitting: Physician Assistant

## 2020-07-19 ENCOUNTER — Telehealth: Payer: Self-pay | Admitting: Physician Assistant

## 2020-07-19 MED ORDER — ALPRAZOLAM 1 MG PO TABS: 1.0000 mg | ORAL_TABLET | Freq: Three times a day (TID) | ORAL | 0 refills | Status: DC | PRN

## 2020-07-19 NOTE — Telephone Encounter (Signed)
Latrece called as follow up from her call on Sunday.  I told her you sent in the hydroxyzine but she was wondering your decision was on the Xanax.  I told her you also had called in Lexapro and she said that you weren't supposed to do that.  Anyway, please give her a call back as follow up

## 2020-07-19 NOTE — Telephone Encounter (Signed)
I sent in a new prescription for the Xanax stating it could be filled early, and patient was notified that this will be the absolute last early feel she gets.  If this happens again, I will stop prescribing controlled substances to her.  She verbalizes understanding.

## 2020-07-25 ENCOUNTER — Encounter: Payer: Self-pay | Admitting: Physician Assistant

## 2020-07-25 ENCOUNTER — Other Ambulatory Visit: Payer: Self-pay

## 2020-07-25 ENCOUNTER — Ambulatory Visit (INDEPENDENT_AMBULATORY_CARE_PROVIDER_SITE_OTHER): Payer: BC Managed Care – PPO | Admitting: Physician Assistant

## 2020-07-25 ENCOUNTER — Telehealth: Payer: Self-pay | Admitting: Physician Assistant

## 2020-07-25 DIAGNOSIS — F99 Mental disorder, not otherwise specified: Secondary | ICD-10-CM

## 2020-07-25 DIAGNOSIS — F339 Major depressive disorder, recurrent, unspecified: Secondary | ICD-10-CM

## 2020-07-25 DIAGNOSIS — F5105 Insomnia due to other mental disorder: Secondary | ICD-10-CM | POA: Diagnosis not present

## 2020-07-25 DIAGNOSIS — F4310 Post-traumatic stress disorder, unspecified: Secondary | ICD-10-CM

## 2020-07-25 DIAGNOSIS — F411 Generalized anxiety disorder: Secondary | ICD-10-CM

## 2020-07-25 DIAGNOSIS — G2581 Restless legs syndrome: Secondary | ICD-10-CM

## 2020-07-25 MED ORDER — ALPRAZOLAM 1 MG PO TABS: 1.0000 mg | ORAL_TABLET | Freq: Three times a day (TID) | ORAL | 2 refills | Status: DC | PRN

## 2020-07-25 NOTE — Telephone Encounter (Signed)
I called to speak with the pharmacist earlier today, but was sent to the voicemail anyway.  I left a message with a question as to why they are not feeling the Xanax today.  I have not received a call back yet.

## 2020-07-25 NOTE — Telephone Encounter (Signed)
pt called to report med is Alprazolam. Stated provider knows all about it
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
## 2020-08-08 ENCOUNTER — Telehealth: Payer: Self-pay | Admitting: Physician Assistant

## 2020-08-08 NOTE — Telephone Encounter (Signed)
[place] left a message stating that the lexapro is working but she is still having crying episodes and that she feeling more depressed. Please call with more options at [phone]

## 2020-08-09 NOTE — Telephone Encounter (Signed)
I called her and left a message on identified voicemail telling her to increase Lexapro up to a total of 30 mg daily.  I asked her to call if she has any questions or needs a refill before her next appointment.

## 2020-09-13 ENCOUNTER — Other Ambulatory Visit: Payer: Self-pay

## 2020-09-13 ENCOUNTER — Ambulatory Visit (INDEPENDENT_AMBULATORY_CARE_PROVIDER_SITE_OTHER): Payer: BC Managed Care – PPO | Admitting: Physician Assistant

## 2020-09-13 ENCOUNTER — Encounter: Payer: Self-pay | Admitting: Physician Assistant

## 2020-09-13 DIAGNOSIS — F99 Mental disorder, not otherwise specified: Secondary | ICD-10-CM

## 2020-09-13 DIAGNOSIS — G2581 Restless legs syndrome: Secondary | ICD-10-CM

## 2020-09-13 DIAGNOSIS — F339 Major depressive disorder, recurrent, unspecified: Secondary | ICD-10-CM | POA: Diagnosis not present

## 2020-09-13 DIAGNOSIS — F411 Generalized anxiety disorder: Secondary | ICD-10-CM | POA: Diagnosis not present

## 2020-09-13 DIAGNOSIS — F5105 Insomnia due to other mental disorder: Secondary | ICD-10-CM

## 2020-09-13 DIAGNOSIS — F4310 Post-traumatic stress disorder, unspecified: Secondary | ICD-10-CM

## 2020-09-13 MED ORDER — LAMOTRIGINE 25 MG PO TABS: ORAL_TABLET | ORAL | 1 refills | Status: DC

## 2020-09-13 NOTE — Progress Notes (Signed)
[facility]  Patient ID: Isabella Martin,  MRN: [id number]  PCP: Leonard Downing  Date of Evaluation: 09/13/2020 Time spent:45 minutes  Chief Complaint:  Chief Complaint    Anxiety; Depression; Insomnia      HISTORY/CURRENT STATUS: For routine med check.  Since our last visit, we increased the Lexapro to 30 mg.  Was not able to tolerate it due to headaches.  Headaches went away when she decreased Lexapro back to 20 mg.  Feels that Xanax isn't working as well as it did. Not lasting long enough. Plus she's been feeling more 'bitchy' and she's been reading that xanax can do that. Is wondering if we could change to Klonopin to see if it's better b/c it lasts longer in her system. Still has a lot of anxiety. Husband told her he's leaving her. Son is doing ok. Her '2nd mother' has been diagnosed with breast cancer. "Those are the only things that have happened this month."   Still has a hard time enjoying things. Energy and motivation are fair to good. Appetite has not changed.  No extreme sadness, tearfulness, or feelings of hopelessness.  Think she could feel better than she does though and would like to do something.  Has read that mood stabilizers can be beneficial.  Denies any changes in concentration, making decisions or remembering things.  Denies suicidal or homicidal thoughts.  Denies decreased energy or motivation.  Appetite has not changed.  No extreme sadness, tearfulness, or feelings of hopelessness.  Denies any changes in concentration, making decisions or remembering things.  Sleeps well. Denies suicidal or homicidal thoughts.  Patient denies increased energy with decreased need for sleep, no increased talkativeness, no racing thoughts, no impulsivity or risky behaviors, no increased spending, no increased libido, no grandiosity, no increased irritability or anger, no paranoia, and no hallucinations.   Denies dizziness, syncope, seizures, numbness, tingling,  tremor, tics, unsteady gait, slurred speech, confusion. Denies muscle or joint pain, stiffness, or dystonia.   Individual Medical History/ Review of Systems: Changes? :No    Past medications for mental health diagnoses include: Sonata, Zoloft, Lunesta, Lexapro, Celexa, Prozac, Wellbutrin caused anger and irritability, Ambiencaused h/a and it's the only thing that helped her get her any sleep,Xanax, trazodone, Dayvigo caused horrible H/A and didn't work at all.  Abilify caused extreme wt gain, Mirtazepine cause wt gain, Vraylar, amytriptylline caused obstipation requiring 2 ER visits, 2 enemas MagCitrate and whole bottle of Miralax,  Seroquel caused tremor, Hydroxyzine caused tremor.  Allergies: Elavil [amitriptyline], Nitrofurantoin, Sulfonamide derivatives, and Wellbutrin [bupropion]  Current Medications:  Current Outpatient Medications:  .  ALPRAZolam (XANAX) 1 MG tablet, Take 1 tablet (1 mg total) by mouth 3 (three) times daily as needed for anxiety., Disp: 90 tablet, Rfl: 2 .  escitalopram (LEXAPRO) 10 MG tablet, Take 2 tablets (20 mg total) by mouth daily., Disp: 180 tablet, Rfl: 1 .  Etonogestrel (NEXPLANON [place]), Inject into the skin., Disp: , Rfl:  .  hydrOXYzine (ATARAX/VISTARIL) 25 MG tablet, Take 1 tablet (25 mg total) by mouth 3 (three) times daily as needed., Disp: 30 tablet, Rfl: 0 .  lamoTRIgine (LAMICTAL) 25 MG tablet, 1 po qhs for 2 weeks, then 2 po qhs., Disp: 60 tablet, Rfl: 1 .  pantoprazole (PROTONIX) 40 MG tablet, Take by mouth., Disp: , Rfl:  .  zolpidem (AMBIEN) 10 MG tablet, TAKE 1 TABLET BY MOUTH AT BEDTIME AS NEEDED FOR SLEEP., Disp: 30 tablet, Rfl: 5 .  pantoprazole (PROTONIX) 40 MG  tablet, Take by mouth daily as needed. , Disp: , Rfl:  Medication Side Effects: none  Family Medical/ Social History: Changes? No  MENTAL HEALTH EXAM:  There were no vitals taken for this visit.There is no height or weight on file to calculate BMI.  General Appearance: Casual, Neat,  Well Groomed and Obese  Eye Contact:  Good  Speech:  Clear and Coherent and Normal Rate  Volume:  Normal  Mood:  Euthymic  Affect:  Appropriate  Thought Process:  Goal Directed and Descriptions of Associations: Intact  Orientation:  Full (Time, Place, and Person)  Thought Content: Logical   Suicidal Thoughts:  No  Homicidal Thoughts:  No  Memory:  WNL  Judgement:  Good  Insight:  Good  Psychomotor Activity:  Normal  Concentration:  Concentration: Good and Attention Span: Good  Recall:  Good  Fund of Knowledge: Good  Language: Good  Assets:  Desire for Improvement  ADL's:  Intact  Cognition: WNL  Prognosis:  Good    DIAGNOSES:    ICD-10-CM   1. 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 Generalized anxiety disorder  F41.1   3. Insomnia due to other mental disorder  F51.05    F99   4. PTSD (post-traumatic stress disorder)  F43.10   5. Restless leg syndrome  G25.81     Receiving Psychotherapy: No    RECOMMENDATIONS:  PDMP was reviewed. I provided 45 mins of face to face time during this encounter, disc BZ and T1/2 of Xanax vs Klonopin.  I am fine to change Xanax to Klonopin however she will have to bring in the remainder of the Xanax in her bottle before I will send in a prescription for Klonopin.  Her next prescription is not due until 09/20/2020 according to [facility] controlled substance registry.  For now, she will stay on Xanax since I will be adding Lamictal.  We discussed Lamictal and how it can help depression and in some people anxiety so we may not need to make a change in benzodiazepines.  [facility] or nurse and the 1 refill left on the Xanax will be canceled.  She will have to call on February 28 or March 1 to let me know how she is, and I can send in Klonopin then. She has read that mood stabilizers can also help with depression and if appropriate would like to try 1.  I recommend Lamictal as it is beneficial for  depression. Counseled patient regarding potential benefits, risks, and side effects of Lamictal to include potential risk of Stevens-Johnson syndrome. Advised patient to stop taking Lamictal and contact office immediately if rash develops and to seek urgent medical attention if rash is severe and/or spreading quickly.  Patient understands and accepts these risks. Continue Xanax 1 mg, 1 p.o. 3 times daily as needed for now.  See above. Continue Lexapro 10 mg, 2 p.o. daily. Start Lamictal 25 mg, 1 p.o. nightly for 2 weeks and then increase to 2 p.o. nightly. Continue hydroxyzine 25 mg, 1 p.o. 3 times daily as needed.  I reminded her that she can take this instead of a benzo, if it is not as effective then she can take a benzo. Continue Ambien 10 mg, 1 p.o. nightly. Recommend therapy. Return in 4 to 6 weeks.    Cont Hydroxyzine 25 mg, 1 po tid prn. Continue Lexapro 20 mg 1 qd. Continue Xanax 1 mg, 1 p.o. 3 times daily  as needed. Pharmacist wouldn't fill early.   Continue Ambien 10 mg, 1 p.o. nightly as needed sleep. Return in 4-5 weeks.  [name], PA-C

## 2020-09-13 NOTE — Patient Instructions (Signed)
Call the office on February 28 or March 1 to let me know that you do not have a rash!  And if not then I will send in Klonopin to the [facility].  I am canceling your last refill on the Xanax so when you call we will need to do 1 or the other.

## 2020-09-14 ENCOUNTER — Telehealth: Payer: Self-pay | Admitting: Physician Assistant

## 2020-09-14 NOTE — Telephone Encounter (Signed)
[facility],she will use [facility]

## 2020-09-14 NOTE — Telephone Encounter (Signed)
Isabella Martin called and said that the Lamotrigine RX she was given isnt covered by her [employer]. She went to [facility] and they said that it would cost $100 out of pocket and insurance wont pay anything. She can't afford to pay this amount for a medication. Is there another one she can take that [employer] should pay for? Pharmacy is [facility] [phone], phone number [phone]. Her number is [phone].

## 2020-09-14 NOTE — Telephone Encounter (Signed)
Noted  

## 2020-09-14 NOTE — Telephone Encounter (Signed)
Please call her.  Lamictal 25 mg #60 is $3.00 at [facility] with their pharmacy membership program which I think is around $35 per year, if she would choose to buy it.  At [facility] it is $9.  At [facility] pay out-of-pocket is $17.55.  All of this info is coming from [facility].  If I need to send the prescription elsewhere, let me know.

## 2020-09-15 NOTE — Telephone Encounter (Signed)
reviewed

## 2020-09-18 ENCOUNTER — Telehealth: Payer: Self-pay | Admitting: Physician Assistant

## 2020-09-18 NOTE — Telephone Encounter (Signed)
She has RF on both. Have her call pharmacy

## 2020-09-18 NOTE — Telephone Encounter (Signed)
Pt called and left a message that she is going to stay on the xanax and she needs a refill on her xanax and her ambien. [facility]. [facility]  [address] in [place]

## 2020-09-18 NOTE — Telephone Encounter (Signed)
Pt [facility] because she had to change pharmacies,she does not currently have a refill [facility].

## 2020-09-19 ENCOUNTER — Other Ambulatory Visit: Payer: Self-pay | Admitting: Physician Assistant

## 2020-09-19 MED ORDER — ALPRAZOLAM 1 MG PO TABS: 1.0000 mg | ORAL_TABLET | Freq: Three times a day (TID) | ORAL | 2 refills | Status: DC | PRN

## 2020-09-19 MED ORDER — ZOLPIDEM TARTRATE 10 MG PO TABS: 10.0000 mg | ORAL_TABLET | Freq: Every evening | ORAL | 2 refills | Status: DC | PRN

## 2020-09-19 NOTE — Telephone Encounter (Signed)
Last PN reviewed.  Sorry about the mix up. New Rx sent to [facility] on both meds.

## 2020-09-19 NOTE — Telephone Encounter (Signed)
reviewed

## 2020-11-13 ENCOUNTER — Emergency Department (INDEPENDENT_AMBULATORY_CARE_PROVIDER_SITE_OTHER)
Admission: RE | Admit: 2020-11-13 | Discharge: 2020-11-13 | Disposition: A | Discharge status: Home / Self Care | Payer: BC Managed Care – PPO | Source: Ambulatory Visit

## 2020-11-13 ENCOUNTER — Encounter: Payer: Self-pay | Admitting: Physician Assistant

## 2020-11-13 ENCOUNTER — Ambulatory Visit (INDEPENDENT_AMBULATORY_CARE_PROVIDER_SITE_OTHER): Payer: BC Managed Care – PPO | Admitting: Physician Assistant

## 2020-11-13 ENCOUNTER — Emergency Department (INDEPENDENT_AMBULATORY_CARE_PROVIDER_SITE_OTHER): Payer: BC Managed Care – PPO

## 2020-11-13 ENCOUNTER — Other Ambulatory Visit: Payer: Self-pay

## 2020-11-13 VITALS — BP 111/79 | HR 111 | Temp 99.0°F

## 2020-11-13 DIAGNOSIS — Z8616 Personal history of COVID-19: Secondary | ICD-10-CM

## 2020-11-13 DIAGNOSIS — F411 Generalized anxiety disorder: Secondary | ICD-10-CM

## 2020-11-13 DIAGNOSIS — F99 Mental disorder, not otherwise specified: Secondary | ICD-10-CM

## 2020-11-13 DIAGNOSIS — J209 Acute bronchitis, unspecified: Secondary | ICD-10-CM

## 2020-11-13 DIAGNOSIS — R059 Cough, unspecified: Secondary | ICD-10-CM

## 2020-11-13 DIAGNOSIS — F5105 Insomnia due to other mental disorder: Secondary | ICD-10-CM

## 2020-11-13 DIAGNOSIS — F3341 Major depressive disorder, recurrent, in partial remission: Secondary | ICD-10-CM | POA: Diagnosis not present

## 2020-11-13 DIAGNOSIS — F4310 Post-traumatic stress disorder, unspecified: Secondary | ICD-10-CM | POA: Diagnosis not present

## 2020-11-13 DIAGNOSIS — R0602 Shortness of breath: Secondary | ICD-10-CM | POA: Diagnosis not present

## 2020-11-13 DIAGNOSIS — G2581 Restless legs syndrome: Secondary | ICD-10-CM

## 2020-11-13 DIAGNOSIS — R0781 Pleurodynia: Secondary | ICD-10-CM

## 2020-11-13 DIAGNOSIS — U099 Post covid-19 condition, unspecified: Secondary | ICD-10-CM

## 2020-11-13 MED ORDER — BENZONATATE 100 MG PO CAPS: 100.0000 mg | ORAL_CAPSULE | Freq: Three times a day (TID) | ORAL | 0 refills | Status: DC

## 2020-11-13 MED ORDER — IPRATROPIUM-ALBUTEROL 0.5-2.5 (3) MG/3ML IN SOLN: 3.0000 mL | RESPIRATORY_TRACT | 0 refills | Status: AC | PRN

## 2020-11-13 MED ORDER — PREDNISONE 10 MG (21) PO TBPK: ORAL_TABLET | Freq: Every day | ORAL | 0 refills | Status: DC

## 2020-11-13 MED ORDER — METHYLPREDNISOLONE SODIUM SUCC 125 MG IJ SOLR
125.0000 mg | Freq: Once | INTRAMUSCULAR | Status: AC
  Administered 2020-11-13: 125 mg via INTRAMUSCULAR

## 2020-11-13 MED ORDER — GUAIFENESIN-CODEINE 100-10 MG/5ML PO SYRP: 5.0000 mL | ORAL_SOLUTION | Freq: Three times a day (TID) | ORAL | 0 refills | Status: DC | PRN

## 2020-11-13 MED ORDER — KETOROLAC TROMETHAMINE 30 MG/ML IJ SOLN: 30.0000 mg | Freq: Once | INTRAMUSCULAR | Status: DC

## 2020-11-13 MED ORDER — HYDROXYZINE HCL 25 MG PO TABS: 25.0000 mg | ORAL_TABLET | Freq: Three times a day (TID) | ORAL | 5 refills | Status: DC | PRN

## 2020-11-13 NOTE — ED Provider Notes (Signed)
[facility]    CSN: [id number] Arrival date & time: 11/13/20  1734      History   Chief Complaint Chief Complaint  Patient presents with  . Appointment    SOB, fatigue    HPI Isabella Martin is a 40 y.o. female.   Reports that Isabella Martin has been coughing for about the last 2 months.  Reports that Isabella Martin has had increasing cough severity, shortness of breath, fatigue, chest pain with coughing.  Reports that Isabella Martin has an inhaler, states that nothing has been able to relieve her cough.  Reports that Isabella Martin had COVID in early February 2022.  Reports that Isabella Martin has been treated with amoxicillin, steroids, ivermectin, Claritin, Benadryl, Mucinex, OTC cough syrup.  States that Isabella Martin has had COVID vaccines.  Has not had a booster.  Denies sick contacts.  Has not had a chest x-ray the entire time that Isabella Martin has been sick.  Denies fever, headache, nausea, vomiting, diarrhea, rash, other symptoms.  ROS per HPI  The history is provided by the patient.    Past Medical History:  Diagnosis Date  . Depression     Patient Active Problem List   Diagnosis Date Noted  . Impaired fasting glucose 01/05/2019  . Hypertriglyceridemia 01/04/2019  . GERD (gastroesophageal reflux disease) 10/09/2018  . MDD (major depressive disorder) 05/18/2018  . GAD (generalized anxiety disorder) 05/18/2018  . Insomnia 05/18/2018  . Posttraumatic stress disorder 05/01/2018  . STREPTOCOCCAL SORE THROAT 05/06/2011    Past Surgical History:  Procedure Laterality Date  . CHOLECYSTECTOMY, LAPAROSCOPIC    . Double Syicter Plasty      OB History   No obstetric history on file.      Home Medications    Prior to Admission medications   Medication Sig Start Date End Date Taking? Authorizing Provider  ALPRAZolam Prudy Feeler) 1 MG tablet Take 1 tablet (1 mg total) by mouth 3 (three) times daily as needed for anxiety. 09/19/20  Yes Hurst, Glade Nurse, PA-C  benzonatate (TESSALON) 100 MG capsule Take 1 capsule (100 mg total) by  mouth every 8 (eight) hours. 11/13/20  Yes Moshe Cipro, NP  escitalopram (LEXAPRO) 10 MG tablet Take 2 tablets (20 mg total) by mouth daily. 07/18/20  Yes Hurst, Teresa T, PA-C  Etonogestrel (NEXPLANON [place]) Inject into the skin.   Yes [provider]  guaiFENesin-codeine (ROBITUSSIN AC) 100-10 MG/5ML syrup Take 5 mLs by mouth 3 (three) times daily as needed for cough. 11/13/20  Yes Moshe Cipro, NP  hydrOXYzine (ATARAX/VISTARIL) 25 MG tablet Take 1 tablet (25 mg total) by mouth 3 (three) times daily as needed. 11/13/20  Yes Hurst, Glade Nurse, PA-C  ipratropium-albuterol (DUONEB) 0.5-2.5 (3) MG/3ML SOLN Take 3 mLs by nebulization every 4 (four) hours as needed. 11/13/20  Yes Moshe Cipro, NP  lamoTRIgine (LAMICTAL) 25 MG tablet 1 po qhs for 2 weeks, then 2 po qhs. 09/13/20  Yes Hurst, Teresa T, PA-C  pantoprazole (PROTONIX) 40 MG tablet Take by mouth. 12/25/18  Yes [provider]  predniSONE (STERAPRED UNI-PAK 21 TAB) 10 MG (21) TBPK tablet Take by mouth daily. Take 6 tabs by mouth daily  for 2 days, then 5 tabs for 2 days, then 4 tabs for 2 days, then 3 tabs for 2 days, 2 tabs for 2 days, then 1 tab by mouth daily for 2 days 11/13/20  Yes Moshe Cipro, NP  zolpidem (AMBIEN) 10 MG tablet Take 1 tablet (10 mg total) by mouth at bedtime as needed. for  sleep 09/19/20  Yes Hurst, Teresa T, PA-C  pantoprazole (PROTONIX) 40 MG tablet Take by mouth daily as needed.  07/27/18 07/27/19  [provider]    Family History Family History  Problem Relation Age of Onset  . Cancer Father   . Healthy Mother     Social History Social History   Tobacco Use  . Smoking status: Current Every Day Smoker    Packs/day: 0.50    Types: Cigarettes  . Smokeless tobacco: Never Used  Substance Use Topics  . Alcohol use: Not Currently    Comment: 1 drink a month  . Drug use: No     Allergies   Elavil [amitriptyline], Nitrofurantoin, Sulfonamide derivatives, and Wellbutrin  [bupropion]   Review of Systems Review of Systems   Physical Exam Triage Vital Signs ED Triage Vitals  Enc Vitals Group     BP      Pulse      Resp      Temp      Temp src      SpO2      Weight      Height      Head Circumference      Peak Flow      Pain Score      Pain Loc      Pain Edu?      Excl. in GC?    No data found.  Updated Vital Signs BP 111/79 (BP Location: Right Arm)   Pulse (!) 111   Temp 99 F (37.2 C) (Oral)   SpO2 94%      Physical Exam Vitals and nursing note reviewed.  Constitutional:      General: Isabella Martin is not in acute distress.    Appearance: Isabella Martin is well-developed. Isabella Martin is ill-appearing.  HENT:     Head: Normocephalic and atraumatic.     Mouth/Throat:     Mouth: Mucous membranes are moist.     Pharynx: Posterior oropharyngeal erythema present.  Eyes:     Extraocular Movements: Extraocular movements intact.     Conjunctiva/sclera: Conjunctivae normal.  Cardiovascular:     Rate and Rhythm: Regular rhythm. Tachycardia present.     Heart sounds: Normal heart sounds. No murmur heard.   Pulmonary:     Effort: Pulmonary effort is normal. No respiratory distress.     Breath sounds: No stridor. Wheezing present. No rhonchi or rales.  Chest:     Chest wall: No tenderness.  Abdominal:     Palpations: Abdomen is soft.     Tenderness: There is no abdominal tenderness.  Musculoskeletal:        General: Normal range of motion.     Cervical back: Normal range of motion and neck supple.  Skin:    General: Skin is warm and dry.     Capillary Refill: Capillary refill takes less than 2 seconds.  Neurological:     General: No focal deficit present.     Mental Status: Isabella Martin is alert and oriented to person, place, and time.  Psychiatric:        Mood and Affect: Mood normal.        Behavior: Behavior normal.        Thought Content: Thought content normal.      UC Treatments / Results  Labs (all labs ordered are listed, but only abnormal results  are displayed) Labs Reviewed - No data to display  EKG   Radiology DG Chest 2 View  Result Date: 11/13/2020 CLINICAL DATA:  Cough and shortness of breath post COVID EXAM: CHEST - 2 VIEW COMPARISON:  None. FINDINGS: The heart size and mediastinal contours are within normal limits. Both lungs are clear. The visualized skeletal structures are unremarkable. IMPRESSION: No active cardiopulmonary disease. Electronically Signed   By: Maudry Mayhew MD   On: 11/13/2020 18:40    Procedures Procedures (including critical care time)  Medications Ordered in UC Medications  methylPREDNISolone sodium succinate (SOLU-MEDROL) 125 mg/2 mL injection 125 mg (125 mg Intramuscular Given 11/13/20 1900)    Initial Impression / Assessment and Plan / UC Course  I have reviewed the triage vital signs and the nursing notes.  Pertinent labs & imaging results that were available during my care of the patient were reviewed by me and considered in my medical decision making (see chart for details).    Acute bronchitis Pleuritic chest pain COVID long-hauler  Chest x-ray today is negative for any pneumonia Solu-Medrol 125 mg IM given in office today Tessalon Perles prescribed for cough as needed.  May take 1 capsule every 8 hours as needed for cough Cheratussin prescribed for cough as needed Sedation precautions given DuoNeb solution prescribed for wheezing, shortness of breath Nebulizer machine given in office Extended steroid taper prescribed given the length of cough, shortness of breath and fatigue Declines COVID and flu testing today Discussed with patient that these symptoms are likely left from her COVID infection Stated that Isabella Martin should note some gradual improvement over the next few days, with more more gradual improvement as time goes on If this is not the case, follow-up with primary care with this office If symptoms acutely worsen, there is trouble swallowing or trouble breathing, high fever,  follow-up in the ER Verbalized understanding is in agreement with treatment plan   Final Clinical Impressions(s) / UC Diagnoses   Final diagnoses:  Acute bronchitis, unspecified organism  Pleuritic chest pain  COVID-19 long hauler     Discharge Instructions     Chest x-ray was negative today  Solu-Medrol 125 mg given IM in office today  You have also received Toradol in the office today for pain  I have sent in a steroid taper for you to take as well. Take 6 tablets for the first two days, take 5 tablets for day three and four, take 4 tablets for days five and six, take 3 tablets for days seven and eight, take 2 tablets for day nine and ten, then take 1 tablet for days eleven and twelve.  I have sent in tessalon perles for you to use one capsule every 8 hours as needed for cough.  I have sent in cough syrup for you to take. This medication can make you sleepy. Do not drive while taking this medication.  I have sent you home with a nebulizer machine.  I have sent in DuoNeb solution for you to use every 4 hours as needed for cough, shortness of breath, wheezing.  Do not use this within 4 hours of your albuterol inhaler.  Follow up with this office or with primary care if symptoms are persisting.  Follow up in the ER for high fever, trouble swallowing, trouble breathing, other concerning symptoms.      ED Prescriptions    Medication Sig Dispense Auth. Provider   predniSONE (STERAPRED UNI-PAK 21 TAB) 10 MG (21) TBPK tablet Take by mouth daily. Take 6 tabs by mouth daily  for 2 days, then 5 tabs for 2 days, then 4 tabs for 2 days, then 3  tabs for 2 days, 2 tabs for 2 days, then 1 tab by mouth daily for 2 days 42 tablet Moshe CiproMatthews, Chadrick Sprinkle, NP   benzonatate (TESSALON) 100 MG capsule Take 1 capsule (100 mg total) by mouth every 8 (eight) hours. 21 capsule Moshe CiproMatthews, Christee Mervine, NP   ipratropium-albuterol (DUONEB) 0.5-2.5 (3) MG/3ML SOLN Take 3 mLs by nebulization every 4 (four) hours  as needed. 360 mL Moshe CiproMatthews, Johnanna Bakke, NP   guaiFENesin-codeine [facility](ROBITUSSIN AC) 100-10 MG/5ML syrup Take 5 mLs by mouth 3 (three) times daily as needed for cough. 120 mL Moshe CiproMatthews, Haron Beilke, NP     PDMP not reviewed this encounter.   Moshe CiproMatthews, Londyn Wotton, NP 11/13/20 1926
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
## 2020-11-13 NOTE — ED Triage Notes (Signed)
Patient states that she had COVID 5 weeks ago.  Now she is having a "wet cough", SOB, fatigues, chest heaviness.  Patient has tried Claritin, Benadryl, Mucinex, OTC cough syrup, Ivermectin, Prednisone, Amoxil.  Patient is vaccinated.

## 2020-11-13 NOTE — Discharge Instructions (Addendum)
Chest x-ray was negative today  Solu-Medrol 125 mg given IM in office today  You have also received Toradol in the office today for pain  I have sent in a steroid taper for you to take as well. Take 6 tablets for the first two days, take 5 tablets for day three and four, take 4 tablets for days five and six, take 3 tablets for days seven and eight, take 2 tablets for day nine and ten, then take 1 tablet for days eleven and twelve.  I have sent in tessalon perles for you to use one capsule every 8 hours as needed for cough.  I have sent in cough syrup for you to take. This medication can make you sleepy. Do not drive while taking this medication.  I have sent you home with a nebulizer machine.  I have sent in DuoNeb solution for you to use every 4 hours as needed for cough, shortness of breath, wheezing.  Do not use this within 4 hours of your albuterol inhaler.  Follow up with this office or with primary care if symptoms are persisting.  Follow up in the ER for high fever, trouble swallowing, trouble breathing, other concerning symptoms.

## 2020-11-17 ENCOUNTER — Telehealth: Payer: Self-pay | Admitting: Emergency Medicine

## 2020-11-17 NOTE — Telephone Encounter (Signed)
Call back to [place] regarding the message left for pain medicine to be called in for her rib pain related to coughing per pt. Confirmed 2 identifiers. RN spoke w/ Dr. Cathren Harsh here today - pt to follow up w/ her PCP. [place] stated she spoke w/ her PCP via My Chart & was told to take tylenol. RN directed pt to contact here PCP again today regarding the need for stronger pain medicine. RN explained that narcotics can not be prescribed w/o a visit and even then they are rarely prescribed . Pt stated she was waiting on her PCP to message her back. RN instructed pt to ask about topical pain patch for soreness to rib area that is affected by coughing. [place] verbalized an understanding

## 2020-11-26 ENCOUNTER — Other Ambulatory Visit: Payer: Self-pay | Admitting: Physician Assistant

## 2020-11-27 ENCOUNTER — Other Ambulatory Visit: Payer: Self-pay | Admitting: Physician Assistant

## 2020-11-27 NOTE — Telephone Encounter (Signed)
Please approve

## 2020-11-28 ENCOUNTER — Ambulatory Visit (INDEPENDENT_AMBULATORY_CARE_PROVIDER_SITE_OTHER): Payer: BC Managed Care – PPO | Admitting: Nurse Practitioner

## 2020-11-28 VITALS — BP 108/83 | HR 121 | Temp 97.9°F | Resp 18

## 2020-11-28 DIAGNOSIS — R000 Tachycardia, unspecified: Secondary | ICD-10-CM | POA: Diagnosis not present

## 2020-11-28 DIAGNOSIS — Z8619 Personal history of other infectious and parasitic diseases: Secondary | ICD-10-CM

## 2020-11-28 MED ORDER — TIZANIDINE HCL 4 MG PO TABS: 4.0000 mg | ORAL_TABLET | Freq: Four times a day (QID) | ORAL | 0 refills | Status: DC | PRN

## 2020-11-28 MED ORDER — MONTELUKAST SODIUM 10 MG PO TABS: 10.0000 mg | ORAL_TABLET | Freq: Every day | ORAL | 3 refills | Status: DC

## 2020-11-28 NOTE — Patient Instructions (Addendum)
History of viral illness Cough Shortness of breath Costochondritis:   Stay well hydrated  Stay active  Deep breathing exercises  May start vitamin C daily, vitamin D3 daily, Zinc daily  May take tylenol for fever or pain  Will order Singulair  Will order tizanadine - for chest pain - cannot tolerate nsaids    Chronic Headaches:  Will place referral to neurology  May start magnesium 600 mg daily  May start riboflavin 400 mg daily   Tachycardia Chest pain:  Will place referral to cardiology    Follow up:  Follow up in 2 months or sooner if needed

## 2020-11-28 NOTE — Progress Notes (Signed)
@Patient  ID: , female    DOB: 06-02-1981, 40 y.o.   MRN: [id number]  Chief Complaint  Patient presents with  . history of covid    Referring provider: [id number], PA-C   HPI  Patient presents today for post-COVID care clinic visit.  Patient states that she never actually tested positive for COVID.  Patient is fully vaccinated.  Patient states that her husband was sick in February and she started having symptoms around September 18, 2020.  She has been having ongoing issues with pain to her right side and rib cage, headaches, tachycardia.  Patient has been treated with amoxicillin, steroids, ivermectin, Claritin, Mucinex.  Patient has also used been using nebulizers at home.  Patient was recently seen in the urgent care at [facility] on 11/13/2020.  Chest x-ray at that visit was clear.  She was given another steroid taper. Denies f/c/s, n/v/d, hemoptysis, PND, chest pain or edema.     Allergies  Allergen Reactions  . Elavil [Amitriptyline] Other (See Comments)     obstipation requiring  2 ER visit, 2 enemas, MagCitrate, and entire bottle of Miralax in Gatorade.  . Nitrofurantoin   . Sulfonamide Derivatives   . Wellbutrin [Bupropion] Other (See Comments)    Agitation and irritability     There is no immunization history on file for this patient.  Past Medical History:  Diagnosis Date  . Depression     Tobacco History: Social History   Tobacco Use  Smoking Status Current Every Day Smoker  . Packs/day: 0.50  . Types: Cigarettes  Smokeless Tobacco Never Used   Ready to quit: No Counseling given: Yes   Outpatient Encounter Medications as of 11/28/2020  Medication Sig  . montelukast (SINGULAIR) 10 MG tablet Take 1 tablet (10 mg total) by mouth at bedtime.  01/28/2021 tiZANidine (ZANAFLEX) 4 MG tablet Take 1 tablet (4 mg total) by mouth every 6 (six) hours as needed for muscle spasms.  . ALPRAZolam (XANAX) 1 MG tablet Take 1 tablet (1 mg total) by mouth 3  (three) times daily as needed for anxiety.  . benzonatate (TESSALON) 100 MG capsule Take 1 capsule (100 mg total) by mouth every 8 (eight) hours.  Marland Kitchen escitalopram (LEXAPRO) 10 MG tablet Take 2 tablets (20 mg total) by mouth daily.  . Etonogestrel (NEXPLANON [place]) Inject into the skin.  Marland Kitchen guaiFENesin-codeine (ROBITUSSIN AC) 100-10 MG/5ML syrup Take 5 mLs by mouth 3 (three) times daily as needed for cough.  . hydrOXYzine (ATARAX/VISTARIL) 25 MG tablet Take 1 tablet (25 mg total) by mouth 3 (three) times daily as needed.  Marland Kitchen ipratropium-albuterol (DUONEB) 0.5-2.5 (3) MG/3ML SOLN Take 3 mLs by nebulization every 4 (four) hours as needed.  . lamoTRIgine (LAMICTAL) 25 MG tablet 1 po qhs for 2 weeks, then 2 po qhs.  . pantoprazole (PROTONIX) 40 MG tablet Take by mouth daily as needed.   . pantoprazole (PROTONIX) 40 MG tablet Take by mouth.  . predniSONE (STERAPRED UNI-PAK 21 TAB) 10 MG (21) TBPK tablet Take by mouth daily. Take 6 tabs by mouth daily  for 2 days, then 5 tabs for 2 days, then 4 tabs for 2 days, then 3 tabs for 2 days, 2 tabs for 2 days, then 1 tab by mouth daily for 2 days  . zolpidem (AMBIEN) 10 MG tablet TAKE 1 TABLET(10 MG) BY MOUTH AT BEDTIME AS NEEDED FOR SLEEP   No facility-administered encounter medications on file as of 11/28/2020.     Review of Systems  Review of Systems  Respiratory: Positive for cough and shortness of breath.   Cardiovascular: Positive for chest pain and palpitations.  Neurological: Positive for headaches.       Physical Exam  BP 108/83   Pulse (!) 121   Temp 97.9 F (36.6 C)   Resp 18   SpO2 98%   Wt Readings from Last 5 Encounters:  03/26/16 169 lb 6.4 oz (76.8 kg)  10/28/12 157 lb (71.2 kg)  10/13/11 163 lb 8 oz (74.2 kg)  05/06/11 161 lb 4 oz (73.1 kg)     Physical Exam Vitals and nursing note reviewed.  Constitutional:      General: She is not in acute distress.    Appearance: She is well-developed.  Cardiovascular:     Rate and  Rhythm: Normal rate and regular rhythm.  Pulmonary:     Effort: Pulmonary effort is normal.     Breath sounds: Normal breath sounds.  Neurological:     Mental Status: She is alert and oriented to person, place, and time.       Imaging: DG Chest 2 View  Result Date: 11/13/2020 CLINICAL DATA:  Cough and shortness of breath post COVID EXAM: CHEST - 2 VIEW COMPARISON:  None. FINDINGS: The heart size and mediastinal contours are within normal limits. Both lungs are clear. The visualized skeletal structures are unremarkable. IMPRESSION: No active cardiopulmonary disease. Electronically Signed   By: Maudry Mayhew MD   On: 11/13/2020 18:40     Assessment & Plan:   History of viral illness Cough Shortness of breath Costochondritis:   Stay well hydrated  Stay active  Deep breathing exercises  May start vitamin C daily, vitamin D3 daily, Zinc daily  May take tylenol for fever or pain  Will order Singulair  Will order tizanadine - for chest pain - cannot tolerate nsaids    Chronic Headaches:  Will place referral to neurology  May start magnesium 600 mg daily  May start riboflavin 400 mg daily   Tachycardia Chest pain:  Will place referral to cardiology    Follow up:  Follow up in 2 months or sooner if needed       Ivonne Andrew, NP 11/29/2020

## 2020-11-29 DIAGNOSIS — Z8619 Personal history of other infectious and parasitic diseases: Secondary | ICD-10-CM | POA: Insufficient documentation

## 2020-11-29 NOTE — Assessment & Plan Note (Signed)
Cough Shortness of breath Costochondritis:   Stay well hydrated  Stay active  Deep breathing exercises  May start vitamin C daily, vitamin D3 daily, Zinc daily  May take tylenol for fever or pain  Will order Singulair  Will order tizanadine - for chest pain - cannot tolerate nsaids    Chronic Headaches:  Will place referral to neurology  May start magnesium 600 mg daily  May start riboflavin 400 mg daily   Tachycardia Chest pain:  Will place referral to cardiology    Follow up:  Follow up in 2 months or sooner if needed

## 2020-12-03 ENCOUNTER — Other Ambulatory Visit: Payer: Self-pay | Admitting: Nurse Practitioner

## 2020-12-05 ENCOUNTER — Telehealth: Payer: Self-pay | Admitting: Physician Assistant

## 2020-12-05 NOTE — Telephone Encounter (Signed)
Please review

## 2020-12-05 NOTE — Telephone Encounter (Signed)
Pt left a message that said she would like to have a phone call from [place] personally when she has a free moment. Please give her a call at  [phone]

## 2020-12-05 NOTE — Telephone Encounter (Signed)
Please triage anyway. Thanks

## 2020-12-06 NOTE — Telephone Encounter (Signed)
Isabella Martin, can you please see if I recently completed a form about this?  I am not sure where it would be, might need to ask [name].  Usually the Sheriff's Department will send a form for [name] to fill out stating she is mentally capable, or not, to safely have a concealed firearm.  If we have already done that, then I will write a letter stating that she is no longer suicidal and that in my opinion, she is mentally stable to have a concealed firearm and should be given a concealed carry permit.  If we have not been sent to 1 of those forms please check on that.  I think the form itself should be enough.  Thank you

## 2020-12-06 NOTE — Telephone Encounter (Signed)
Please review messages

## 2020-12-06 NOTE — Telephone Encounter (Signed)
Pt stated she went to renew her license to concealed carry and they denied her because somewhere on her records shows that she is suicidal.She wants to know if you can write a letter stating she is no longer suicidal and stable enough to carry a firearm.She needs to know if you are able to do this before she tries to file an appeal.

## 2020-12-11 ENCOUNTER — Other Ambulatory Visit: Payer: Self-pay | Admitting: Physician Assistant

## 2020-12-12 NOTE — Telephone Encounter (Signed)
Last refill at [facility], change in pharmacy  Apt 7/26

## 2020-12-13 ENCOUNTER — Telehealth: Payer: Self-pay | Admitting: Physician Assistant

## 2020-12-13 NOTE — Telephone Encounter (Signed)
Pt called and said that the [facility] doesn't have the script for the xanax. Please resend

## 2020-12-14 NOTE — Telephone Encounter (Signed)
Pt called again requesting this letter.I don't see anything under media and beth has not responded.If it was sent before it was not uploaded.She really needs it.

## 2020-12-14 NOTE — Telephone Encounter (Signed)
Confirmed with pharmacy she has Rx ready for pickup.

## 2020-12-14 NOTE — Telephone Encounter (Signed)
Erian can you check on this for [name].  I think we would have been sent an official concealed carry form that comes through the Sheriff's office to [name]. But I don't remember for sure. Would it be somewhere you know of, if it was done? I need to know so I can dictate a letter if needed, but maybe Traci would have completed a form and won't need a letter.  Thanks.

## 2020-12-15 NOTE — Telephone Encounter (Signed)
Barbie Banner copied her most recent records and the concealed carry permit that I have already filled out and signed.  I do not understand why a letter is needed since this form was completed.  I called and left a message on Brittany's voicemail, letting her know these things although I did not go into full detail as her voicemail was identified but I am not sure how security is.  I asked her to call the office back with that information.

## 2020-12-15 NOTE — Telephone Encounter (Signed)
Isabella Martin, I've got the copy of permit I signed and y'all faxed 11/23/2020. I'll put this in the admin box, can you call the sheriff's office and see what the problem is? On the form I signed, I didn't say she was suicidal. It may have been on old notes somewhere, I didn't go back that far. In my opinion, she is ok to have a concealed carry permit.

## 2020-12-15 NOTE — Telephone Encounter (Signed)
Rtc to pt and she stated they took her concealed carry permit away due to the form saying she was suicidal.She has to go before a judge to appeal it but can't do so without a letter from you stating she is not suicidal and is capable of having a concealed carry permit.She stated she has had this permit for 15 years and wishes to keep it.

## 2021-01-02 ENCOUNTER — Other Ambulatory Visit: Payer: Self-pay

## 2021-01-04 ENCOUNTER — Encounter: Payer: Self-pay | Admitting: Cardiology

## 2021-01-04 ENCOUNTER — Other Ambulatory Visit: Payer: Self-pay

## 2021-01-04 ENCOUNTER — Ambulatory Visit (INDEPENDENT_AMBULATORY_CARE_PROVIDER_SITE_OTHER): Payer: BC Managed Care – PPO | Admitting: Cardiology

## 2021-01-04 VITALS — BP 120/74 | HR 91 | Ht 64.0 in | Wt 205.0 lb

## 2021-01-04 DIAGNOSIS — E669 Obesity, unspecified: Secondary | ICD-10-CM

## 2021-01-04 DIAGNOSIS — R002 Palpitations: Secondary | ICD-10-CM

## 2021-01-04 DIAGNOSIS — R400 Somnolence: Secondary | ICD-10-CM

## 2021-01-04 DIAGNOSIS — R5383 Other fatigue: Secondary | ICD-10-CM

## 2021-01-04 DIAGNOSIS — R0602 Shortness of breath: Secondary | ICD-10-CM | POA: Diagnosis not present

## 2021-01-04 DIAGNOSIS — R0683 Snoring: Secondary | ICD-10-CM

## 2021-01-04 NOTE — Progress Notes (Signed)
Cardiology Office Note:    Date:  01/04/2021   ID:  Isabella Martin, DOB Jul 30, 1980, MRN [id number]  PCP:  Nathaneil Canary, PA-C  Cardiologist:  Thomasene Ripple, DO  Electrophysiologist:  None   Referring MD: Ivonne Andrew, NP   No chief complaint on file. " I have had palpitations , fatigue and shortness of breath since covid in February  History of Present Illness:    Isabella Martin is a 40 y.o. female with a hx of obesity, anxiety, depression, history of COVID-19 infection in February 2022 comes to be evaluated for palpitations, fatigue as well as shortness of breath.  The patient tells me that she has been experiencing intermittent palpitations since she had COVID-19 infection.  She described as an abrupt onset of fast heartbeat which last for minutes at a time and then resolved.  Nothing makes it better or worse. In addition over the last several weeks she has had intermittent shortness of breath with significant bradycardia of the fatigue.  She is concerned.  She also admits that she snores and has had some daytime somnolence.  Past Medical History:  Diagnosis Date   Depression     Past Surgical History:  Procedure Laterality Date   CHOLECYSTECTOMY, LAPAROSCOPIC     Double Syicter Plasty      Current Medications: Current Meds  Medication Sig   ALPRAZolam (XANAX) 1 MG tablet TAKE 1 TABLET(1 MG) BY MOUTH THREE TIMES DAILY AS NEEDED FOR ANXIETY   escitalopram (LEXAPRO) 10 MG tablet Take 2 tablets (20 mg total) by mouth daily.   Etonogestrel (NEXPLANON [place]) Inject into the skin.   guaiFENesin-codeine (ROBITUSSIN AC) 100-10 MG/5ML syrup Take 5 mLs by mouth 3 (three) times daily as needed for cough.   hydrOXYzine (ATARAX/VISTARIL) 25 MG tablet Take 1 tablet (25 mg total) by mouth 3 (three) times daily as needed.   ipratropium-albuterol (DUONEB) 0.5-2.5 (3) MG/3ML SOLN Take 3 mLs by nebulization every 4 (four) hours as needed.   lamoTRIgine (LAMICTAL) 25 MG tablet 1 po qhs  for 2 weeks, then 2 po qhs.   metoprolol tartrate (LOPRESSOR) 25 MG tablet Take 25 mg by mouth 2 (two) times daily.   montelukast (SINGULAIR) 10 MG tablet Take 1 tablet (10 mg total) by mouth at bedtime.   pantoprazole (PROTONIX) 40 MG tablet Take by mouth.   zolpidem (AMBIEN) 10 MG tablet TAKE 1 TABLET(10 MG) BY MOUTH AT BEDTIME AS NEEDED FOR SLEEP     Allergies:   Elavil [amitriptyline], Nitrofurantoin, Sulfonamide derivatives, and Wellbutrin [bupropion]   Social History   Socioeconomic History   Marital status: Married    Spouse name: Not on file   Number of children: Not on file   Years of education: Not on file   Highest education level: Not on file  Occupational History   Not on file  Tobacco Use   Smoking status: Every Day    Packs/day: 0.50    Pack years: 0.00    Types: Cigarettes   Smokeless tobacco: Never  Substance and Sexual Activity   Alcohol use: Not Currently    Comment: 1 drink a month   Drug use: No   Sexual activity: Yes  Other Topics Concern   Not on file  Social History Narrative   Not on file   Social Determinants of Health   Financial Resource Strain: Not on file  Food Insecurity: Not on file  Transportation Needs: Not on file  Physical Activity: Not on file  Stress: Not on file  Social Connections: Not on file     Family History: The patient's family history includes Cancer in her father; Healthy in her mother.  ROS:   Review of Systems  Constitution: Negative for decreased appetite, fever and weight gain.  HENT: Negative for congestion, ear discharge, hoarse voice and sore throat.   Eyes: Negative for discharge, redness, vision loss in right eye and visual halos.  Cardiovascular: Negative for chest pain, dyspnea on exertion, leg swelling, orthopnea and palpitations.  Respiratory: Negative for cough, hemoptysis, shortness of breath and snoring.   Endocrine: Negative for heat intolerance and polyphagia.  Hematologic/Lymphatic: Negative for  bleeding problem. Does not bruise/bleed easily.  Skin: Negative for flushing, nail changes, rash and suspicious lesions.  Musculoskeletal: Negative for arthritis, joint pain, muscle cramps, myalgias, neck pain and stiffness.  Gastrointestinal: Negative for abdominal pain, bowel incontinence, diarrhea and excessive appetite.  Genitourinary: Negative for decreased libido, genital sores and incomplete emptying.  Neurological: Negative for brief paralysis, focal weakness, headaches and loss of balance.  Psychiatric/Behavioral: Negative for altered mental status, depression and suicidal ideas.  Allergic/Immunologic: Negative for HIV exposure and persistent infections.    EKGs/Labs/Other Studies Reviewed:    The following studies were reviewed today:   EKG:  The ekg ordered today demonstrates sinus rhythm heart rate 91 bpm  Recent Labs: No results found for requested labs within last 8760 hours.  Recent Lipid Panel No results found for: CHOL, TRIG, HDL, CHOLHDL, VLDL, LDLCALC, LDLDIRECT  Physical Exam:    VS:  BP 120/74 (BP Location: Right Arm)   Pulse 91   Ht 5\' 4"  (1.626 m)   Wt 205 lb (93 kg)   SpO2 95%   BMI 35.19 kg/m     Wt Readings from Last 3 Encounters:  01/04/21 205 lb (93 kg)  03/26/16 169 lb 6.4 oz (76.8 kg)  10/28/12 157 lb (71.2 kg)     GEN: Well nourished, well developed in no acute distress HEENT: Normal NECK: No JVD; No carotid bruits LYMPHATICS: No lymphadenopathy CARDIAC: S1S2 noted,RRR, no murmurs, rubs, gallops RESPIRATORY:  Clear to auscultation without rales, wheezing or rhonchi  ABDOMEN: Soft, non-tender, non-distended, +bowel sounds, no guarding. EXTREMITIES: No edema, No cyanosis, no clubbing MUSCULOSKELETAL:  No deformity  SKIN: Warm and dry NEUROLOGIC:  Alert and oriented x 3, non-focal PSYCHIATRIC:  Normal affect, good insight  ASSESSMENT:    1. Fatigue, unspecified type   2. Shortness of breath   3. Daytime somnolence   4. Obesity (BMI  30-39.9)   5. Snoring   6. Palpitations    PLAN:    Her palpitations she had been started on Lopressor by her PCP she tells me this has improved significantly.  I do suspect that her palpitation may be related to autonomic dysfunction from COVID-19 infection.  Shortness of breath is still a problem along with her fatigue.  Given her history of COVID-19 infection I will like to get an echocardiogram to assess her LV systolic function.  In addition with her fatigue daytime somnolence and snoring the patient needs to be evaluated for sleep apnea.  We will get this study ordered. For completeness she has history of vitamin D deficiency and with her fatigue we will recheck her vitamin D levels. The patient understands the need to lose weight with diet and exercise. We have discussed specific strategies for this.  The patient is in agreement with the above plan. The patient left the office in stable condition.  The patient will follow up in 12 weeks   Medication Adjustments/Labs and Tests Ordered: Current medicines are reviewed at length with the patient today.  Concerns regarding medicines are outlined above.  Orders Placed This Encounter  Procedures   VITAMIN D 25 Hydroxy (Vit-D Deficiency, Fractures)   EKG 12-Lead   ECHOCARDIOGRAM COMPLETE   Split night study   No orders of the defined types were placed in this encounter.   Patient Instructions  Medication Instructions:  Your physician recommends that you continue on your current medications as directed. Please refer to the Current Medication list given to you today.  *If you need a refill on your cardiac medications before your next appointment, please call your pharmacy*   Lab Work: Your physician recommends that you return for lab work in: TODAY Vitamin D If you have labs (blood work) drawn today and your tests are completely normal, you will receive your results only by: MyChart Message (if you have MyChart) OR A paper copy in  the mail If you have any lab test that is abnormal or we need to change your treatment, we will call you to review the results.   Testing/Procedures: Your physician has requested that you have an echocardiogram. Echocardiography is a painless test that uses sound waves to create images of your heart. It provides your doctor with information about the size and shape of your heart and how well your heart's chambers and valves are working. This procedure takes approximately one hour. There are no restrictions for this procedure.  Your physician has recommended that you have a sleep study. This test records several body functions during sleep, including: brain activity, eye movement, oxygen and carbon dioxide blood levels, heart rate and rhythm, breathing rate and rhythm, the flow of air through your mouth and nose, snoring, body muscle movements, and chest and belly movement.    Follow-Up: At [facility], you and your health needs are our priority.  As part of our continuing mission to provide you with exceptional heart care, we have created designated Provider Care Teams.  These Care Teams include your primary Cardiologist (physician) and Advanced Practice Providers (APPs -  Physician Assistants and Nurse Practitioners) who all work together to provide you with the care you need, when you need it.  We recommend signing up for the patient portal called "MyChart".  Sign up information is provided on this After Visit Summary.  MyChart is used to connect with patients for Virtual Visits (Telemedicine).  Patients are able to view lab/test results, encounter notes, upcoming appointments, etc.  Non-urgent messages can be sent to your provider as well.   To learn more about what you can do with MyChart, go to [email or web address].    Your next appointment:   6 month(s)  The format for your next appointment:   In Person  Provider:   [facility] with Dr. Servando Salina   Other Instructions    Adopting a  Healthy Lifestyle.  Know what a healthy weight is for you (roughly BMI <25) and aim to maintain this   Aim for 7+ servings of fruits and vegetables daily   65-80+ fluid ounces of water or unsweet tea for healthy kidneys   Limit to max 1 drink of alcohol per day; avoid smoking/tobacco   Limit animal fats in diet for cholesterol and heart health - choose grass fed whenever available   Avoid highly processed foods, and foods high in saturated/trans fats   Aim for low stress - take time to  unwind and care for your mental health   Aim for 150 min of moderate intensity exercise weekly for heart health, and weights twice weekly for bone health   Aim for 7-9 hours of sleep daily   When it comes to diets, agreement about the perfect plan isnt easy to find, even among the experts. Experts at the [facility] [employer] developed an idea known as the Healthy Eating Plate. Just imagine a plate divided into logical, healthy portions.   The emphasis is on diet quality:   Load up on vegetables and fruits - one-half of your plate: Aim for color and variety, and remember that potatoes dont count.   Go for whole grains - one-quarter of your plate: Whole wheat, barley, wheat berries, quinoa, oats, brown rice, and foods made with them. If you want pasta, go with whole wheat pasta.   Protein power - one-quarter of your plate: Fish, chicken, beans, and nuts are all healthy, versatile protein sources. Limit red meat.   The diet, however, does go beyond the plate, offering a few other suggestions.   Use healthy plant oils, such as olive, canola, soy, corn, sunflower and peanut. Check the labels, and avoid partially hydrogenated oil, which have unhealthy trans fats.   If youre thirsty, drink water. Coffee and tea are good in moderation, but skip sugary drinks and limit milk and dairy products to one or two daily servings.   The type of carbohydrate in the diet is more important than the  amount. Some sources of carbohydrates, such as vegetables, fruits, whole grains, and beans-are healthier than others.   Finally, stay active  Signed, Thomasene Ripple, DO  01/04/2021 12:39 PM    [facility]

## 2021-01-04 NOTE — Patient Instructions (Signed)
Medication Instructions:  Your physician recommends that you continue on your current medications as directed. Please refer to the Current Medication list given to you today.  *If you need a refill on your cardiac medications before your next appointment, please call your pharmacy*   Lab Work: Your physician recommends that you return for lab work in: TODAY Vitamin D If you have labs (blood work) drawn today and your tests are completely normal, you will receive your results only by: MyChart Message (if you have MyChart) OR A paper copy in the mail If you have any lab test that is abnormal or we need to change your treatment, we will call you to review the results.   Testing/Procedures: Your physician has requested that you have an echocardiogram. Echocardiography is a painless test that uses sound waves to create images of your heart. It provides your doctor with information about the size and shape of your heart and how well your heart's chambers and valves are working. This procedure takes approximately one hour. There are no restrictions for this procedure.  Your physician has recommended that you have a sleep study. This test records several body functions during sleep, including: brain activity, eye movement, oxygen and carbon dioxide blood levels, heart rate and rhythm, breathing rate and rhythm, the flow of air through your mouth and nose, snoring, body muscle movements, and chest and belly movement.    Follow-Up: At [facility], you and your health needs are our priority.  As part of our continuing mission to provide you with exceptional heart care, we have created designated Provider Care Teams.  These Care Teams include your primary Cardiologist (physician) and Advanced Practice Providers (APPs -  Physician Assistants and Nurse Practitioners) who all work together to provide you with the care you need, when you need it.  We recommend signing up for the patient portal called  "MyChart".  Sign up information is provided on this After Visit Summary.  MyChart is used to connect with patients for Virtual Visits (Telemedicine).  Patients are able to view lab/test results, encounter notes, upcoming appointments, etc.  Non-urgent messages can be sent to your provider as well.   To learn more about what you can do with MyChart, go to [email or web address].    Your next appointment:   6 month(s)  The format for your next appointment:   In Person  Provider:   [facility] with Dr. Servando Salina   Other Instructions

## 2021-01-05 ENCOUNTER — Telehealth: Payer: Self-pay | Admitting: *Deleted

## 2021-01-05 LAB — VITAMIN D 25 HYDROXY (VIT D DEFICIENCY, FRACTURES): Vit D, 25-Hydroxy: 16.4 ng/mL — ABNORMAL LOW (ref 30.0–100.0)

## 2021-01-05 MED ORDER — VITAMIN D (ERGOCALCIFEROL) 1.25 MG (50000 UNIT) PO CAPS: 50000.0000 [IU] | ORAL_CAPSULE | ORAL | 0 refills | Status: AC

## 2021-01-05 NOTE — Telephone Encounter (Signed)
-----   Message from Thomasene Ripple, DO sent at 01/05/2021 11:02 AM EDT ----- Vitamin D deficiency I would like to replete that with 50,000 units once a week for 12 weeks.

## 2021-01-05 NOTE — Telephone Encounter (Signed)
Spoke with pt and reviewed results and recommendations.  Pt agreeable to plan.  She states that prescription has to go under [place], not [place], or insurance won't cover it.  Advised I will place a note to pharmacy when I sent script and if any trouble getting it picked up, please let us know.  Pt agreeable to plan.

## 2021-01-09 ENCOUNTER — Telehealth: Payer: Self-pay | Admitting: *Deleted

## 2021-01-09 ENCOUNTER — Other Ambulatory Visit: Payer: Self-pay | Admitting: Cardiology

## 2021-01-09 DIAGNOSIS — R0683 Snoring: Secondary | ICD-10-CM

## 2021-01-09 DIAGNOSIS — R400 Somnolence: Secondary | ICD-10-CM

## 2021-01-09 NOTE — Telephone Encounter (Signed)
Staff message sent to [employer] denied in lab split night sleep study. Patient does not meet medical criteria. HST was approved. Order # [id number]. Valid dates 01/09/21 to 03/09/21.

## 2021-01-25 ENCOUNTER — Other Ambulatory Visit: Payer: Self-pay | Admitting: Psychiatry

## 2021-01-25 ENCOUNTER — Telehealth: Payer: Self-pay

## 2021-01-25 ENCOUNTER — Other Ambulatory Visit: Payer: Self-pay | Admitting: Physician Assistant

## 2021-01-25 MED ORDER — ALPRAZOLAM 1 MG PO TABS: ORAL_TABLET | ORAL | 0 refills | Status: DC

## 2021-01-25 NOTE — Telephone Encounter (Signed)
Isabella Martin called to check on refill of her Alprazolam.  She said her insurance card has her name as Isabella Martin and the prescription needs to be sent in as Isabella Martin.  It causes problems if it goes in as [name].  She just has today as last dose.  Appt 7/26

## 2021-01-25 NOTE — Telephone Encounter (Signed)
I dont' have a way to change her name on RX other than we can print it off and hand write her name on it but she'll need to come pick up RX if we do this.

## 2021-01-25 NOTE — Telephone Encounter (Signed)
Spoke with patient about her sleep study instructions. Patient verbalizes understanding. No further questions or concerns at this time.

## 2021-01-25 NOTE — Telephone Encounter (Signed)
Is there a way to even change the name on her Rx?

## 2021-01-29 ENCOUNTER — Ambulatory Visit: Payer: BC Managed Care – PPO

## 2021-02-05 ENCOUNTER — Ambulatory Visit (HOSPITAL_COMMUNITY): Payer: BC Managed Care – PPO | Attending: Internal Medicine

## 2021-02-05 ENCOUNTER — Other Ambulatory Visit: Payer: Self-pay

## 2021-02-05 DIAGNOSIS — R5383 Other fatigue: Secondary | ICD-10-CM | POA: Insufficient documentation

## 2021-02-05 DIAGNOSIS — R400 Somnolence: Secondary | ICD-10-CM | POA: Insufficient documentation

## 2021-02-05 DIAGNOSIS — R0602 Shortness of breath: Secondary | ICD-10-CM | POA: Insufficient documentation

## 2021-02-05 LAB — ECHOCARDIOGRAM COMPLETE
Area-P 1/2: 3.42 cm2
S' Lateral: 2.3 cm

## 2021-02-05 MED ORDER — PERFLUTREN LIPID MICROSPHERE
1.0000 mL | INTRAVENOUS | Status: AC | PRN
  Administered 2021-02-05: 2 mL via INTRAVENOUS

## 2021-02-13 ENCOUNTER — Encounter: Payer: Self-pay | Admitting: Physician Assistant

## 2021-02-13 ENCOUNTER — Other Ambulatory Visit: Payer: Self-pay

## 2021-02-13 ENCOUNTER — Ambulatory Visit (INDEPENDENT_AMBULATORY_CARE_PROVIDER_SITE_OTHER): Payer: BC Managed Care – PPO | Admitting: Physician Assistant

## 2021-02-13 VITALS — BP 115/72 | HR 91

## 2021-02-13 DIAGNOSIS — F3341 Major depressive disorder, recurrent, in partial remission: Secondary | ICD-10-CM

## 2021-02-13 DIAGNOSIS — F411 Generalized anxiety disorder: Secondary | ICD-10-CM | POA: Diagnosis not present

## 2021-02-13 DIAGNOSIS — F4310 Post-traumatic stress disorder, unspecified: Secondary | ICD-10-CM | POA: Diagnosis not present

## 2021-02-13 DIAGNOSIS — F5105 Insomnia due to other mental disorder: Secondary | ICD-10-CM

## 2021-02-13 DIAGNOSIS — F99 Mental disorder, not otherwise specified: Secondary | ICD-10-CM

## 2021-02-13 DIAGNOSIS — T50905A Adverse effect of unspecified drugs, medicaments and biological substances, initial encounter: Secondary | ICD-10-CM

## 2021-02-13 DIAGNOSIS — G2581 Restless legs syndrome: Secondary | ICD-10-CM | POA: Diagnosis not present

## 2021-02-13 DIAGNOSIS — R635 Abnormal weight gain: Secondary | ICD-10-CM

## 2021-02-13 MED ORDER — ZOLPIDEM TARTRATE 10 MG PO TABS: ORAL_TABLET | ORAL | 2 refills | Status: AC

## 2021-02-13 MED ORDER — ESCITALOPRAM OXALATE 10 MG PO TABS: 20.0000 mg | ORAL_TABLET | Freq: Every day | ORAL | 1 refills | Status: AC

## 2021-02-13 MED ORDER — ALPRAZOLAM 1 MG PO TABS: ORAL_TABLET | ORAL | 2 refills | Status: DC

## 2021-02-13 MED ORDER — LAMOTRIGINE 25 MG PO TABS: 50.0000 mg | ORAL_TABLET | Freq: Every day | ORAL | 1 refills | Status: DC

## 2021-02-13 NOTE — Progress Notes (Signed)
[facility]  Patient ID: Isabella Martin,  MRN: [id number]  PCP: Leonard Downing  Date of Evaluation: 02/13/2021 Time spent:40 minutes  Chief Complaint:  Chief Complaint   Follow-up; Depression; Anxiety     HISTORY/CURRENT STATUS: HPI For routine med check.   RLS is worse.  Lexapro aggravates it. Leg movements start around lunch time. Takes Lexapro at 7:30 am. Arms sometimes jerk too and that is even more aggravating than her legs alone.  The leg movements occur every night but fortunately the abnormal arm movements only happen a couple of times a month.  As far as current medications for her moods go, she is doing well. Is able to enjoy things.  Denies decreased energy or motivation.  Appetite has not changed.  No extreme sadness, tearfulness, or feelings of hopelessness.  Denies any changes in concentration, making decisions or remembering things.  Denies suicidal or homicidal thoughts.  She is still gaining weight despite lifestyle changes of diet and exercise.  Unsure exactly how much she has gained but her clothes do not fit like they used to.  Has been researching different medications to help that side effect of her mental health medications.  She asks specifically about Contrave and also Qsymia.  Anxiety is very well controlled but she does require the Xanax 3 times daily.  Hydroxyzine is helpful also.  She sleeps well as long as she has the Ambien.  Work is going fine.  She asked about the denial of her concealed weapon permit.  She has had this for approximately 15 years but was denied recently even though I signed off on it.  Refer to a scanned copy of that letter under media.  Patient denies increased energy with decreased need for sleep, no increased talkativeness, no racing thoughts, no impulsivity or risky behaviors, no increased spending, no increased libido, no grandiosity, no increased irritability or anger, and no hallucinations.  Denies dizziness,  syncope, seizures, numbness, tingling, tremor, tics, unsteady gait, slurred speech, confusion. Denies muscle or joint pain, stiffness, or dystonia.  Individual Medical History/ Review of Systems: Changes? :Yes  Dx w/ COVID long-haul syndrome. Told she has heart, brain, and lung problems now. Has a sleep study coming up.   Past medications for mental health diagnoses include: Sonata, Zoloft, Lunesta, Lexapro, Celexa, Prozac, Wellbutrin caused anger and irritability, Ambien caused h/a and it's the only thing that helped her get her any sleep,  Xanax, trazodone, Dayvigo caused horrible H/A and didn't work at all.  Abilify caused extreme wt gain, Mirtazepine cause wt gain, Vraylar, amytriptylline caused obstipation requiring 2 ER visits, 2 enemas MagCitrate and whole bottle of Miralax,  Seroquel caused tremor, Hydroxyzine caused tremor. Requip for RSL approx 2009 which caused n/v.   Allergies: Elavil [amitriptyline], Nitrofurantoin, Sulfonamide derivatives, and Wellbutrin [bupropion]  Current Medications:  Current Outpatient Medications:    Etonogestrel (NEXPLANON [place]), Inject into the skin., Disp: , Rfl:    hydrOXYzine (ATARAX/VISTARIL) 25 MG tablet, Take 1 tablet (25 mg total) by mouth 3 (three) times daily as needed., Disp: 30 tablet, Rfl: 5   ipratropium-albuterol (DUONEB) 0.5-2.5 (3) MG/3ML SOLN, Take 3 mLs by nebulization every 4 (four) hours as needed., Disp: 360 mL, Rfl: 0   metoprolol tartrate (LOPRESSOR) 25 MG tablet, Take 25 mg by mouth 2 (two) times daily., Disp: , Rfl:    pantoprazole (PROTONIX) 40 MG tablet, Take by mouth., Disp: , Rfl:    topiramate (TOPAMAX) 25 MG tablet, One p.o. nightly for 1  week then increase to 2 pills p.o. nightly., Disp: 60 tablet, Rfl: 1   Vitamin D, Ergocalciferol, (DRISDOL) 1.25 MG (50000 UNIT) CAPS capsule, Take 1 capsule (50,000 Units total) by mouth every 7 (seven) days., Disp: 12 capsule, Rfl: 0   ALPRAZolam (XANAX) 1 MG tablet, TAKE 1 TABLET(1 MG) BY  MOUTH THREE TIMES DAILY AS NEEDED FOR ANXIETY, Disp: 90 tablet, Rfl: 2   escitalopram (LEXAPRO) 10 MG tablet, Take 2 tablets (20 mg total) by mouth daily., Disp: 180 tablet, Rfl: 1   guaiFENesin-codeine (ROBITUSSIN AC) 100-10 MG/5ML syrup, Take 5 mLs by mouth 3 (three) times daily as needed for cough. (Patient not taking: Reported on 02/13/2021), Disp: 120 mL, Rfl: 0   lamoTRIgine (LAMICTAL) 25 MG tablet, Take 2 tablets (50 mg total) by mouth daily., Disp: 180 tablet, Rfl: 1   montelukast (SINGULAIR) 10 MG tablet, Take 1 tablet (10 mg total) by mouth at bedtime. (Patient not taking: Reported on 02/13/2021), Disp: 30 tablet, Rfl: 3   zolpidem (AMBIEN) 10 MG tablet, TAKE 1 TABLET(10 MG) BY MOUTH AT BEDTIME AS NEEDED FOR SLEEP, Disp: 30 tablet, Rfl: 2 Medication Side Effects: none  Family Medical/ Social History: Changes? No  MENTAL HEALTH EXAM:  Blood pressure 115/72, pulse 91.There is no height or weight on file to calculate BMI.  General Appearance: Casual, Well Groomed and Obese  Eye Contact:  Good  Speech:  Clear and Coherent and Normal Rate  Volume:  Normal  Mood:  Euthymic  Affect:  Appropriate  Thought Process:  Goal Directed and Descriptions of Associations: Circumstantial  Orientation:  Full (Time, Place, and Person)  Thought Content: Logical   Suicidal Thoughts:  No  Homicidal Thoughts:  No  Memory:  WNL  Judgement:  Good  Insight:  Good  Psychomotor Activity:  Normal  Concentration:  Concentration: Good and Attention Span: Good  Recall:  Good  Fund of Knowledge: Good  Language: Good  Assets:  Desire for Improvement  ADL's:  Intact  Cognition: WNL  Prognosis:  Good     DIAGNOSES:    ICD-10-CM   1. Recurrent major depressive disorder, in partial remission (HCC)  F33.41     2. Generalized anxiety disorder  F41.1     3. Restless leg syndrome  G25.81     4. PTSD (post-traumatic stress disorder)  F43.10     5. Insomnia due to other mental disorder  F51.05    F99      6. Weight gain due to medication  R63.5    T50.905A        Receiving Psychotherapy: No    RECOMMENDATIONS:  PDMP was reviewed. Xanax filled 01/25/2021 I provided 40  minutes of face to face time during this encounter, including time spent before and after the visit in records review, medical decision making, and charting.  We discussed the restless leg syndrome and options for treatment.  Gabapentin may be very helpful but it often comes with weight gain.  Since she is already overweight and concerned about that, gabapentin is not a good choice.  I recommend adding Topamax.  I have discussed this with Dr. Jennelle Human, and it will be used off label for restless leg but it is worth a try.  It is also used on label for migraine prevention and to curb appetite when combined with another medication in the drug called Contrave. She does have a sleep study coming up in about 2 to 3 weeks so we decided to make no changes until  after that time, as a new medication may skew the results. We discussed Qsymia and Contrave the pros and cons and my recommendation is still Topamax alone at least for now.  She asked again about the denial for her concealed carry permit.  A letter was faxed to the Sheriff's office (see under media on chart) requesting that they rescind their decision, based on thorough records review with no concern of suicidal or homicidal thoughts.  She is given a copy of that letter today. Start Topamax 25 mg, 1 p.o. nightly for 1 week and then increase to 2 p.o. nightly.  The dose can be increased by 25 mg weekly until next OV.  Again I recommend not starting that until after her in-home sleep test. Continue Xanax 1 mg, 1 p.o. 3 times daily as needed. Continue Lexapro 10 mg, 2 p.o. daily. Continue hydroxyzine 25 mg 1 p.o. 3 times daily as needed. Continue Lamictal 25 mg, 2 p.o. daily. Continue Ambien 10 mg, 1 p.o. nightly as needed sleep. Return in 2 months.  [name], PA-C

## 2021-02-14 ENCOUNTER — Telehealth: Payer: Self-pay | Admitting: Physician Assistant

## 2021-02-14 MED ORDER — TOPIRAMATE 25 MG PO TABS: ORAL_TABLET | ORAL | 1 refills | Status: DC

## 2021-02-14 NOTE — Telephone Encounter (Signed)
Isabella Martin, please call and let her know that I sent in Topamax to [facility] [address].   We discussed this yesterday and she will be expecting her call. Thanks.

## 2021-02-14 NOTE — Telephone Encounter (Signed)
Pt informed

## 2021-03-02 ENCOUNTER — Other Ambulatory Visit: Payer: Self-pay

## 2021-03-02 ENCOUNTER — Ambulatory Visit (HOSPITAL_BASED_OUTPATIENT_CLINIC_OR_DEPARTMENT_OTHER): Payer: BC Managed Care – PPO | Attending: Cardiology | Admitting: Cardiovascular Disease

## 2021-03-02 DIAGNOSIS — R0683 Snoring: Secondary | ICD-10-CM | POA: Diagnosis not present

## 2021-03-02 DIAGNOSIS — G478 Other sleep disorders: Secondary | ICD-10-CM

## 2021-03-02 DIAGNOSIS — R400 Somnolence: Secondary | ICD-10-CM

## 2021-03-10 ENCOUNTER — Other Ambulatory Visit: Payer: Self-pay | Admitting: Physician Assistant

## 2021-03-11 ENCOUNTER — Encounter (HOSPITAL_BASED_OUTPATIENT_CLINIC_OR_DEPARTMENT_OTHER): Payer: Self-pay | Admitting: Cardiovascular Disease

## 2021-03-11 NOTE — Procedures (Signed)
     Patient Name: Isabella Martin, Isabella Martin Date: 03/03/2021 Gender: Female D.O.B: 1981-04-03 Age (years): 39 Referring Provider: Lavona Mound Tobb DO Height (inches): 64 Interpreting Physician: Nicki Guadalajara MD, ABSM Weight (lbs): 200 RPSGT:  Sink BMI: 34 MRN: [id number] Neck Size: 15.00  CLINICAL INFORMATION Sleep Study Type: HST  Indication for sleep study: snoring, daytime somnolence  Epworth Sleepiness Score: 6  SLEEP STUDY TECHNIQUE A multi-channel overnight portable sleep study was performed. The channels recorded were: nasal airflow, thoracic respiratory movement, and oxygen saturation with a pulse oximetry. Snoring was also monitored.  MEDICATIONS ALPRAZolam (XANAX) 1 MG tablet escitalopram (LEXAPRO) 10 MG tablet Etonogestrel (NEXPLANON ) guaiFENesin-codeine (ROBITUSSIN AC) 100-10 MG/5ML syrup hydrOXYzine (ATARAX/VISTARIL) 25 MG tablet ipratropium-albuterol (DUONEB) 0.5-2.5 (3) MG/3ML SOLN lamoTRIgine (LAMICTAL) 25 MG tablet metoprolol tartrate (LOPRESSOR) 25 MG tablet montelukast (SINGULAIR) 10 MG tablet (PROTONIX) 40 MG tablet topiramate (TOPAMAX) 25 MG tablet Vitamin D, Ergocalciferol, (DRISDOL) 1.25 MG (50000 UNIT) CAPS capsule zolpidem (AMBIEN) 10 MG tablet Patient self administered medications include: N/A.  SLEEP ARCHITECTURE Patient was studied for 383.2 minutes. The sleep efficiency was 100.0 % and the patient was supine for 0%. The arousal index was 0.0 per hour.  RESPIRATORY PARAMETERS The overall AHI was 3.8 per hour, with a central apnea index of 0 per hour.  The oxygen nadir was 83% during sleep.  CARDIAC DATA Mean heart rate during sleep was 88.2 bpm.  IMPRESSIONS - No significant obstructive sleep apnea occurred during this study (AHI 3.8/h); Supine sleep was absent. - Moderate oxygen desaturation to a nadir of 83%. Time spent < 89% was 0.2 minutes. - Patient snored 3.3% during the sleep.  DIAGNOSIS - Mild increased upper airway  resistance (UARS) - Snoring  RECOMMENDATIONS - At present, there is no indication for CPAP.  Supine sleep was absent on this study and the positional effect on sleep disordered breathing cannot be assessed.  - Effort should be made to optimize nasal and oropharyngeal patency. - Avoid alcohol, sedatives and other CNS depressants that may worsen sleep apnea and disrupt normal sleep architecture. - Sleep hygiene should be reviewed to assess factors that may improve sleep quality. - Weight management (BMI 34) and regular exercise should be initiated or continued.   [Electronically signed] 03/11/2021 10:11 AM  Nicki Guadalajara MD, [facility], ABSM Diplomate, American Board of Sleep Medicine   NPI: [id number] [facility] PH: [phone]   FX: [phone] ACCREDITED BY THE AMERICAN ACADEMY OF SLEEP MEDICINE

## 2021-03-12 ENCOUNTER — Telehealth: Payer: Self-pay | Admitting: Physician Assistant

## 2021-03-12 NOTE — Telephone Encounter (Signed)
Next visit is 04/18/21. [place] called and said that she is on Gabapentin and she doesn't sleep well or not sleep at all. Could this be from the Gabapentin? Her phone number is [phone].

## 2021-03-12 NOTE — Telephone Encounter (Signed)
Pt clarified she is done with her sleep study.You wanted her to get that done before you started her on gabapentin.She wanted you to review it

## 2021-03-12 NOTE — Telephone Encounter (Signed)
Please review

## 2021-03-12 NOTE — Telephone Encounter (Signed)
I don't see that she's on Gabapentin. Please clarify.

## 2021-03-13 NOTE — Telephone Encounter (Signed)
She said yes she has been taking topamax and [facility] is [facility] [address]

## 2021-03-13 NOTE — Telephone Encounter (Signed)
LVM for pt to return call

## 2021-03-13 NOTE — Telephone Encounter (Signed)
From reviewing my last note, we had discussed the gabapentin but because it can sometimes cause weight gain we agreed not to start that, but I prescribed Topamax instead.  She was supposed to wait to have a sleep study done and then start the Topamax.  If she has not started the Topamax and is willing to try the gabapentin, I will send the gabapentin in.  I do think it would be better than Topamax for her symptoms.  If she agrees, let me know which pharmacy to send the prescription to. I do not have a full report on her sleep study yet.

## 2021-03-14 ENCOUNTER — Other Ambulatory Visit: Payer: Self-pay | Admitting: Physician Assistant

## 2021-03-14 ENCOUNTER — Telehealth: Payer: Self-pay | Admitting: *Deleted

## 2021-03-14 MED ORDER — GABAPENTIN (ONCE-DAILY) 600 MG PO TABS: 600.0000 mg | ORAL_TABLET | Freq: Every day | ORAL | 1 refills | Status: DC

## 2021-03-14 NOTE — Telephone Encounter (Signed)
Please let her know that we discussed starting EITHER the Topamax OR the gabapentin, after her sleep study was done.  I sent in a prescription for the gabapentin for the restless leg syndrome.  But if the Topamax is helping the restless leg syndrome symptoms then she can stay on the Topamax and not take the gabapentin.

## 2021-03-14 NOTE — Telephone Encounter (Signed)
-----   Message from Lennette Bihari, MD sent at 03/11/2021 10:16 AM EDT ----- Burna Mortimer, please notify pt of the results

## 2021-03-14 NOTE — Telephone Encounter (Signed)
Patient notified of HST results. 

## 2021-03-14 NOTE — Telephone Encounter (Signed)
Pt is confused.She thought she was taking the Topamax for weight loss.I asked her was it helping her restless legs and she said she had a flare up the other day.She will stop the Topamax and start gabapentin.

## 2021-03-28 ENCOUNTER — Ambulatory Visit (INDEPENDENT_AMBULATORY_CARE_PROVIDER_SITE_OTHER): Payer: BC Managed Care – PPO | Admitting: Neurology

## 2021-03-28 ENCOUNTER — Encounter: Payer: Self-pay | Admitting: Neurology

## 2021-03-28 VITALS — BP 110/74 | HR 94 | Ht 64.0 in | Wt 213.2 lb

## 2021-03-28 DIAGNOSIS — G43009 Migraine without aura, not intractable, without status migrainosus: Secondary | ICD-10-CM | POA: Diagnosis not present

## 2021-03-28 MED ORDER — UBRELVY 50 MG PO TABS: 50.0000 mg | ORAL_TABLET | ORAL | 3 refills | Status: DC | PRN

## 2021-03-28 NOTE — Patient Instructions (Addendum)
It was nice to meet you today.  You probably have episodic migraines.  Thankfully, your exam is normal.  Nevertheless, I would like to consider a brain MRI in the near future, I understand that you have other tests that you have to think about due to cost.  We can revisit at your next visit.  You can call us back with the name of your nausea medication, we can certainly call in a prescription for as needed nausea medication for you.  Please remember, common headache triggers are: sleep deprivation, dehydration, overheating, stress, hypoglycemia or skipping meals and blood sugar fluctuations, excessive pain medications or excessive alcohol use or caffeine withdrawal. Some people have food triggers such as aged cheese, orange juice or chocolate, especially dark chocolate, or MSG (monosodium glutamate). Try to avoid these headache triggers as much possible. It may be helpful to keep a headache diary to figure out what makes your headaches worse or brings them on and what alleviates them. Some people report headache onset after exercise but studies have shown that regular exercise may actually prevent headaches from coming. If you have exercise-induced headaches, please make sure that you drink plenty of fluid before and after exercising and that you do not over do it and do not overheat.  Please try to reduce your caffeine intake by reducing your sweet tea consumption, try to limit yourself to 1 or 2 servings per day and hydrate well with water.  Try to get enough rest, 7 to 8 hours of sleep are generally recommended.  For migraine prevention, I think you are on medications currently and that may help prevent headaches.  We utilize a beta-blocker for migraine prevention, you are currently on metoprolol.   You have had some medication changes in the past couple of weeks.  You are no longer on the Topamax but have started gabapentin.  We use gabapentin also for headache prevention so I would like to give it some  time.  You are also on Lexapro which we also use as a migraine preventative.   For migraine acute treatment, I would like to suggest: Ubrelvy, 50 mg strength: Take 1 pill at onset of migraine headache, may repeat in 2 hours, no more than 4 pills in 24 hours, i.e. not to exceed 200 mg per 24 hours. May cause sedation and nausea.  Please follow-up routinely to see one of our nurse practitioners in 3 months.

## 2021-03-28 NOTE — Progress Notes (Signed)
Subjective:    Patient ID: Isabella Martin is a 40 y.o. female.  HPI    Isabella FoleySaima Jacqualine Weichel, MD, PhD [facility] [address], [address] [address] [place], [place] [address]  Dear Isabella Martin,   I saw your patient, Isabella Martin, upon your kind request in my neurologic clinic today for initial consultation of her recurrent headaches.  She is unaccompanied today.  As you know, Isabella Martin is a 40 year old right-handed woman with an underlying medical history of anxiety depression, reflux disease, IBS, gastritis, Hx of Covid, and obesity, who reports recurrent headaches especially since she was diagnosed with COVID in February 2022.  She reports infrequent migraine-like headaches in the past, as long as 15 years ago, sometimes triggered but usually self-limited.  In the past several months she has had recurrent headaches which are throbbing, typically bifrontal and associated with nausea, typically no vomiting and light sensitivity.  She had an eye examination about a year ago.  She has bifocal prescription eyeglasses.  She has a family history of migraines affecting her mother and maternal aunt.  She has not had a brain scan.  She would like to avoid doing any additional testing due to cost.  She has not noticed any triggers at this time.  She tries to hydrate well with water, she does drink caffeine in the form of sweet tea, about 4 glasses/day.  She is smokes about a half a pack per day.  She is currently not working actively on smoking cessation.  She does not drink any alcohol.  She lives with her husband and 41 year old son.  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 She denies any sudden onset of one-sided weakness or numbness or tingling or droopy face or slurring of speech.  She typically does not have any visual symptoms. Of note, she is followed by psychiatry, she typically sees Isabella Martin.  She has had some medication changes in the recent past.  She is currently on  Lexapro 20 mg daily.  She was on Topamax 25 mg twice daily and this was stopped about 2 weeks ago and she was started on gabapentin which is currently 600 mg in the evening.  She has been on Lamictal 50 mg daily but this was stopped.  She was started several months ago on Lopressor 25 mg twice daily for tachycardia.  She has also seen cardiology.  I reviewed your office records from 12/23/2020, she saw Isabella GrammesShontay Butler, NP at the time.  She has had a recent home sleep test through cardiology on 03/03/2021, which showed an AHI of 3.8, O2 nadir 83%.  She has been using nausea medication.  She reports a history of irritable bowel syndrome and gastritis.  She has a headache about once a week at this time and takes Tylenol.  She cannot take nonsteroidals.  Tylenol does not typically help very much.  Her Past Medical History Is Significant For: Past Medical History:  Diagnosis Date   Anxiety    COVID-19 long hauler    Depression    Fatigue    GERD (gastroesophageal reflux disease)    IBS (irritable bowel syndrome)    Insomnia    Migraine    PTSD (post-traumatic stress disorder)    Restless leg syndrome     Her Past Surgical History Is Significant For: Past Surgical History:  Procedure Laterality Date   CHOLECYSTECTOMY, LAPAROSCOPIC     [employer] manageryicter Plasty  Her Family History Is Significant For: Family History  Problem Relation Age of Onset   Healthy Mother    Depression Mother    Cancer Father     Her Social History Is Significant For: Social History   Socioeconomic History   Marital status: Married    Spouse name: Not on file   Number of children: Not on file   Years of education: Not on file   Highest education level: Not on file  Occupational History   Not on file  Tobacco Use   Smoking status: Every Day    Packs/day: 0.50    Types: Cigarettes   Smokeless tobacco: Never  Substance and Sexual Activity   Alcohol use: Not Currently    Comment: 1 drink a month   Drug  use: No   Sexual activity: Yes  Other Topics Concern   Not on file  Social History Narrative   Lives home with husband and 13yo son.  [employer], works Biomedical scientist.     Social Determinants of Health   Financial Resource Strain: Not on file  Food Insecurity: Not on file  Transportation Needs: Not on file  Physical Activity: Not on file  Stress: Not on file  Social Connections: Not on file    Her Allergies Are:  Allergies  Allergen Reactions   Elavil [Amitriptyline] Other (See Comments)     obstipation requiring  2 ER visit, 2 enemas, MagCitrate, and entire bottle of Miralax in Gatorade.   Nitrofurantoin    Sulfonamide Derivatives    Wellbutrin [Bupropion] Other (See Comments)    Agitation and irritability  :   Her Current Medications Are:  Outpatient Encounter Medications as of 03/28/2021  Medication Sig   ALPRAZolam (XANAX) 1 MG tablet TAKE 1 TABLET(1 MG) BY MOUTH THREE TIMES DAILY AS NEEDED FOR ANXIETY   escitalopram (LEXAPRO) 10 MG tablet Take 2 tablets (20 mg total) by mouth daily.   Etonogestrel (NEXPLANON [place]) Inject into the skin.   ipratropium-albuterol (DUONEB) 0.5-2.5 (3) MG/3ML SOLN Take 3 mLs by nebulization every 4 (four) hours as needed.   metoprolol tartrate (LOPRESSOR) 25 MG tablet Take 25 mg by mouth 2 (two) times daily.   pantoprazole (PROTONIX) 40 MG tablet Take by mouth.   Vitamin D, Ergocalciferol, (DRISDOL) 1.25 MG (50000 UNIT) CAPS capsule Take 1 capsule (50,000 Units total) by mouth every 7 (seven) days.   zolpidem (AMBIEN) 10 MG tablet TAKE 1 TABLET(10 MG) BY MOUTH AT BEDTIME AS NEEDED FOR SLEEP   Gabapentin, Once-Daily, 600 MG TABS Take 600 mg by mouth at bedtime. Take 1 approximately 4 hours prior to bedtime.   lamoTRIgine (LAMICTAL) 25 MG tablet Take 2 tablets (50 mg total) by mouth daily. (Patient not taking: Reported on 03/28/2021)   topiramate (TOPAMAX) 25 MG tablet Take 1 tablet (25 mg total) by mouth 2 (two) times daily. (Patient not  taking: Reported on 03/28/2021)   [DISCONTINUED] guaiFENesin-codeine (ROBITUSSIN AC) 100-10 MG/5ML syrup Take 5 mLs by mouth 3 (three) times daily as needed for cough. (Patient not taking: No sig reported)   [DISCONTINUED] hydrOXYzine (ATARAX/VISTARIL) 25 MG tablet Take 1 tablet (25 mg total) by mouth 3 (three) times daily as needed.   [DISCONTINUED] montelukast (SINGULAIR) 10 MG tablet Take 1 tablet (10 mg total) by mouth at bedtime. (Patient not taking: Reported on 02/13/2021)   No facility-administered encounter medications on file as of 03/28/2021.  :   Review of Systems:  Out of a complete 14 point review of systems,  all are reviewed and negative with the exception of these symptoms as listed below:   Review of Systems  Neurological:        Episodic tension headaches.  Noted headaches since having COVID (2-29-22). 2-3 x week, debilitating.  Has one now.     Objective:  Neurological Exam  Physical Exam Physical Examination:   Vitals:   03/28/21 1411  BP: 110/74  Pulse: 94    General Examination: The patient is a very pleasant 40 y.o. female in no acute distress. She appears well-developed and well-nourished and well groomed.   HEENT: Normocephalic, atraumatic, pupils are equal, round and reactive to light and accommodation. Funduscopic exam is normal with sharp disc margins noted. Extraocular tracking is good without limitation to gaze excursion or nystagmus noted. Normal smooth pursuit is noted. Hearing is grossly intact. Face is symmetric with normal facial animation and normal facial sensation. Speech is clear with no dysarthria noted. There is no hypophonia. There is no lip, neck/head, jaw or voice tremor. Neck is supple with full range of passive and active motion. There are no carotid bruits on auscultation. Oropharynx exam reveals: mild mouth dryness, adequate dental hygiene. Tongue protrudes centrally and palate elevates symmetrically.  Chest: Clear to auscultation without  wheezing, rhonchi or crackles noted.  Heart: S1+S2+0, regular and normal without murmurs, rubs or gallops noted.   Abdomen: Soft, non-tender and non-distended with normal bowel sounds appreciated on auscultation.  Extremities: There is no pitting edema in the distal lower extremities bilaterally. Pedal pulses are intact.  Skin: Warm and dry without trophic changes noted.  Musculoskeletal: exam reveals no obvious joint deformities, tenderness or joint swelling or erythema.   Neurologically:  Mental status: The patient is awake, alert and oriented in all 4 spheres. Her immediate and remote memory, attention, language skills and fund of knowledge are appropriate. There is no evidence of aphasia, agnosia, apraxia or anomia. Speech is clear with normal prosody and enunciation. Thought process is linear. Mood is normal and affect is normal.  Cranial nerves II - XII are as described above under HEENT exam. In addition: shoulder shrug is normal with equal shoulder height noted. Motor exam: Normal bulk, strength and tone is noted. There is no drift, tremor or rebound. Romberg is negative. Reflexes are 2+ throughout. Babinski: Toes are flexor bilaterally. Fine motor skills and coordination: intact with normal finger taps, normal hand movements, normal rapid alternating patting, normal foot taps and normal foot agility.  Cerebellar testing: No dysmetria or intention tremor on finger to nose testing. Heel to shin is unremarkable bilaterally. There is no truncal or gait ataxia.  Sensory exam: intact to light touch, vibration, temperature sense in the upper and lower extremities.  Gait, station and balance: She stands easily. No veering to one side is noted. No leaning to one side is noted. Posture is age-appropriate and stance is narrow based. Gait shows normal stride length and normal pace. No problems turning are noted. Tandem walk is unremarkable.                Assessment and Plan:   In summary, Nyesha Cliff is a very pleasant 40 y.o.-year old female with an underlying medical history of anxiety depression, reflux disease, IBS, gastritis, Hx of Covid, and obesity, who who presents for evaluation of her recurrent headaches.  She has a remote history of infrequent migraines in the past but has had more recurrent headaches since she was diagnosed with COVID in February 2022.  She  has a bifrontal headache, headache frequency about once a week at this point.  She has likely episodic migraines without aura, not intractable at this time but has been on medications that we utilized typically for migraine prevention.  She has been on Topamax in the recent past but this was changed to gabapentin per psychiatry.  I advised her that we should give her a little bit longer to get adjusted to her new medications.  She has also been on a beta-blocker which we also utilize for migraine prevention.  I had a long discussion with her about preventative treatments and headache triggers in general.  We talked about the importance of lifestyle modification.  She is encouraged to work on smoking cessation and reduce her caffeine intake by reducing her sweet tea intake and limit herself to 1, maybe 2 servings per day.  She is reminded to stay well-hydrated with water and well rested, try to achieve 7 to 8 hours of sleep on any given night.  She had a recent home sleep test which did not indicate any significant obstructive sleep apnea.  Her neurological exam is nonfocal, she has had an updated eye examination within the year.  She is largely reassured.  I suggested we proceed with a brain MRI to rule out a structural cause of her symptoms but she would like to hold off due to cost concern.  Given that she has a nonfocal examination and her history is not alarming for any strokelike symptoms or focal neurologic presentation I think we can certainly hold off and revisit in the near future.  For as needed use I suggested she try  Ubrelvy for acute migraines.  We will start with the 50 mg strength as needed.  She can take a second pill after 2 hours if needed.  She is reminded not to take more than 2 pills in 24 hours.  She is reminded of the common side effects of this medication.  I would like to avoid a triptan in her case as she is on an SSRI at this time.  She is requesting a refill on her nausea medication.  She did not recall the name and is encouraged to look it up at home.  We can certainly provide a refill for as needed nausea medication at this time.  She is advised to follow-up routinely in this clinic in 3 months to see one of our nurse practitioners.  I answered all her questions today and she was in agreement with the plan.   Thank you very much for allowing me to participate in the care of this nice patient. If I can be of any further assistance to you please do not hesitate to call me at [phone].  Sincerely,   Isabella Foley, MD, PhD

## 2021-03-29 MED ORDER — ONDANSETRON HCL 4 MG PO TABS: 4.0000 mg | ORAL_TABLET | Freq: Three times a day (TID) | ORAL | 0 refills | Status: DC | PRN

## 2021-03-29 NOTE — Telephone Encounter (Signed)
I have sent a prescription for Zofran 4 mg strength, for as needed use, 20 pills, no refills.  Patient has been advised via MyChart message to talk to her GI specialist or primary care about further refills, especially if she has used the Zofran for her GI related symptoms in the past.

## 2021-04-03 ENCOUNTER — Other Ambulatory Visit: Payer: Self-pay | Admitting: Physician Assistant

## 2021-04-03 ENCOUNTER — Telehealth: Payer: Self-pay | Admitting: Physician Assistant

## 2021-04-03 NOTE — Telephone Encounter (Signed)
Pt informed.Also she stated she can't message you on my chart because you are not listed as her provider.She contacted my chart and they told her you had to add her somehow.Maybe if you send her a message on there she will be able to contact you in the future.

## 2021-04-03 NOTE — Telephone Encounter (Signed)
Please review.Is it still ok to take both?

## 2021-04-03 NOTE — Telephone Encounter (Signed)
Yes, it's ok. She might have more drowsiness when she takes both together, but that is the only contraindication I know of.

## 2021-04-03 NOTE — Telephone Encounter (Signed)
Isabella Martin called to report that the gabapentin is amazing, but her pharmacist told her she can't take the gabapentin with the hydroxyzine that you prescribe for panic.  Please call to discuss.  Appt 9/28

## 2021-04-12 ENCOUNTER — Telehealth: Payer: Self-pay | Admitting: Neurology

## 2021-04-12 NOTE — Telephone Encounter (Signed)
[employer] Harriett Sine) called, request update on patient's plan of care. Would like a call from the nurse.  Contact info: [phone], Ext: [phone]

## 2021-04-12 NOTE — Telephone Encounter (Signed)
Returned Nancy's call and LVM with office number for call back.

## 2021-04-17 ENCOUNTER — Telehealth: Payer: Self-pay

## 2021-04-17 NOTE — Telephone Encounter (Signed)
I submitted a PA request for Ubrelvy 50mg  on [facility],  Key - PA Case ID: Minerva Fester.  Received instant approval  approved from 04/17/2021 to 04/17/2022.

## 2021-04-18 ENCOUNTER — Encounter: Payer: Self-pay | Admitting: Physician Assistant

## 2021-04-18 ENCOUNTER — Encounter: Payer: Self-pay | Admitting: Neurology

## 2021-04-18 ENCOUNTER — Telehealth (INDEPENDENT_AMBULATORY_CARE_PROVIDER_SITE_OTHER): Payer: BC Managed Care – PPO | Admitting: Physician Assistant

## 2021-04-18 ENCOUNTER — Telehealth: Payer: Self-pay | Admitting: Physician Assistant

## 2021-04-18 DIAGNOSIS — F411 Generalized anxiety disorder: Secondary | ICD-10-CM

## 2021-04-18 DIAGNOSIS — F32A Depression, unspecified: Secondary | ICD-10-CM

## 2021-04-18 DIAGNOSIS — R5383 Other fatigue: Secondary | ICD-10-CM | POA: Diagnosis not present

## 2021-04-18 DIAGNOSIS — G2581 Restless legs syndrome: Secondary | ICD-10-CM

## 2021-04-18 DIAGNOSIS — F99 Mental disorder, not otherwise specified: Secondary | ICD-10-CM

## 2021-04-18 DIAGNOSIS — Z79899 Other long term (current) drug therapy: Secondary | ICD-10-CM

## 2021-04-18 DIAGNOSIS — F5105 Insomnia due to other mental disorder: Secondary | ICD-10-CM

## 2021-04-18 MED ORDER — TOPIRAMATE 25 MG PO TABS: 25.0000 mg | ORAL_TABLET | Freq: Two times a day (BID) | ORAL | 1 refills | Status: DC

## 2021-04-18 MED ORDER — ALPRAZOLAM 1 MG PO TABS: ORAL_TABLET | ORAL | 0 refills | Status: AC

## 2021-04-18 MED ORDER — UBRELVY 50 MG PO TABS: 50.0000 mg | ORAL_TABLET | ORAL | 3 refills | Status: DC | PRN

## 2021-04-18 NOTE — Telephone Encounter (Signed)
Received approval letter. I faxed this to the pharmacy. Received a receipt of confirmation.

## 2021-04-18 NOTE — Progress Notes (Signed)
[facility] Med Check  Patient ID: Isabella Martin,  MRN: [id number]  PCP: Leonard Downing  Date of Evaluation: 04/18/2021 Time spent:40 minutes  Chief Complaint:  Chief Complaint   Anxiety; Depression; Insomnia      HISTORY/CURRENT STATUS: HPI For routine med check.   RLS is better controlled w/ Gabapentin. Then says "I don't know if it's helping or not." Not having sx only at night, can happen during the daytime too.  It is aggravating to have her legs feel like they need to move all the time and even when she moves some it does not help that strange feeling.  States she is not sleeping well at all.  After a lot of discussion concerning the anxiety and the fact that she is getting the Xanax filled early each month as noted below, she tells me she is taking 3 mg of Xanax at bedtime along with the Ambien.  Initially says she is not sleeping well, feels tired when she gets up, after I mentioned that we need to change to something else for sleep, she tells me that regimen helps.  Had sleep study done in August, diagnosed with upper airway resistance syndrome, snoring and somnolence.  No CPAP was indicated.  Recommendation to avoid alcohol, sedatives and other CNS depressants which can worsen sleep problems.  Patient denies loss of interest in usual activities and is able to enjoy things.  Feels tired a lot and does not want to do much of anything.  She is able to work without problems though.  Appetite has not changed.  No extreme sadness, tearfulness, or feelings of hopelessness.  Denies any changes in concentration, making decisions or remembering things.  Denies suicidal or homicidal thoughts.  Patient denies increased energy with decreased need for sleep, no increased talkativeness, no racing thoughts, no impulsivity or risky behaviors, no increased spending, no increased libido, no grandiosity, no increased irritability or anger, and no hallucinations.  Review of Systems   Constitutional:  Positive for malaise/fatigue.  HENT: Negative.    Eyes: Negative.   Respiratory: Negative.    Cardiovascular: Negative.   Gastrointestinal: Negative.   Genitourinary: Negative.   Musculoskeletal: Negative.   Skin: Negative.   Neurological:        See HPI.  Endo/Heme/Allergies: Negative.   Psychiatric/Behavioral:         See HPI.    Individual Medical History/ Review of Systems: Changes? :Yes   sleep study done. See results on chart.  Past medications for mental health diagnoses include: Sonata, Zoloft, Lunesta, Belsomra wasn't effective, Lexapro, Celexa, Prozac, Wellbutrin caused anger and irritability, Ambien caused h/a and it's the only thing that helped her get her any sleep,  Xanax, trazodone, Dayvigo caused horrible H/A and didn't work at all.  Abilify caused extreme wt gain, Mirtazepine cause wt gain, Vraylar, amytriptylline caused obstipation requiring 2 ER visits, 2 enemas MagCitrate and whole bottle of Miralax,  Seroquel caused tremor, Hydroxyzine caused tremor. Requip for RSL approx 2009 which caused n/v, melatonin causes jitteriness.    Allergies: Elavil [amitriptyline], Nitrofurantoin, Sulfonamide derivatives, and Wellbutrin [bupropion]  Current Medications:  Current Outpatient Medications:    escitalopram (LEXAPRO) 10 MG tablet, Take 2 tablets (20 mg total) by mouth daily., Disp: 180 tablet, Rfl: 1   Etonogestrel (NEXPLANON [place]), Inject into the skin., Disp: , Rfl:    hydrOXYzine (ATARAX/VISTARIL) 25 MG tablet, Take 25 mg by mouth 3 (three) times daily. Takes rarely, Disp: , Rfl:    ipratropium-albuterol (DUONEB) 0.5-2.5 (  3) MG/3ML SOLN, Take 3 mLs by nebulization every 4 (four) hours as needed., Disp: 360 mL, Rfl: 0   metoprolol tartrate (LOPRESSOR) 25 MG tablet, Take 25 mg by mouth 2 (two) times daily., Disp: , Rfl:    ondansetron (ZOFRAN) 4 MG tablet, Take 1 tablet (4 mg total) by mouth every 8 (eight) hours as needed for nausea or vomiting., Disp: 20  tablet, Rfl: 0   pantoprazole (PROTONIX) 40 MG tablet, Take by mouth., Disp: , Rfl:    Ubrogepant (UBRELVY) 50 MG TABS, Take 50 mg by mouth as needed (may repeat once in 2 hours. no more than 2 pills in 24 h.)., Disp: 10 tablet, Rfl: 3   Vitamin D, Ergocalciferol, (DRISDOL) 1.25 MG (50000 UNIT) CAPS capsule, Take 1 capsule (50,000 Units total) by mouth every 7 (seven) days., Disp: 12 capsule, Rfl: 0   zolpidem (AMBIEN) 10 MG tablet, TAKE 1 TABLET(10 MG) BY MOUTH AT BEDTIME AS NEEDED FOR SLEEP, Disp: 30 tablet, Rfl: 2   [START ON 05/15/2021] ALPRAZolam (XANAX) 1 MG tablet, TAKE 1 TABLET(1 MG) BY MOUTH THREE TIMES DAILY AS NEEDED FOR ANXIETY, Disp: 90 tablet, Rfl: 0   topiramate (TOPAMAX) 25 MG tablet, Take 1 tablet (25 mg total) by mouth 2 (two) times daily., Disp: 60 tablet, Rfl: 1 Medication Side Effects: none  Family Medical/ Social History: Changes? No  MENTAL HEALTH EXAM:  There were no vitals taken for this visit.There is no height or weight on file to calculate BMI.  General Appearance: Casual, Well Groomed and Obese  Eye Contact:  Good  Speech:  Clear and Coherent and Normal Rate  Volume:  Normal  Mood:   After discussing the early refills of Xanax and Ambien she became visibly frustrated and irritable.  Affect:  Appropriate  Thought Process:  Goal Directed and Descriptions of Associations: Circumstantial  Orientation:  Full (Time, Place, and Person)  Thought Content: Logical   Suicidal Thoughts:  No  Homicidal Thoughts:  No  Memory:  WNL  Judgement:  Good  Insight:  Good  Psychomotor Activity:  Normal  Concentration:  Concentration: Good and Attention Span: Good  Recall:  Good  Fund of Knowledge: Good  Language: Good  Assets:  Desire for Improvement  ADL's:  Intact  Cognition: WNL  Prognosis:  Good   Sleep study 03/02/2021 reviewed. No OSA, but snores No indication for CPAP.   DIAGNOSES:    ICD-10-CM   1. Generalized anxiety disorder  F41.1     2. Restless leg  syndrome  G25.81     3. Insomnia due to other mental disorder  F51.05    F99     4. Other fatigue  R53.83     5. Polypharmacy  Z79.899     6. Depression, unspecified depression type  F32.A         Receiving Psychotherapy: No    RECOMMENDATIONS:  PDMP was reviewed. Last Xanax filled 04/16/2021, 03/20/2021, 02/21/2021, 01/25/2021 (son's friend stole them in July) 12/29/2020. Ambien 04/08/2021, 02/13/2021. I provided 30 minutes of non-face-to-face time during this encounter, including time spent before and after the visit in records review, medical decision making, counseling pertinent to today's visit, and charting.   Discussed the fact that she's getting Xanax and Ambien filled too early. Each month for the past 4 months. That is NOT acceptable. She says pharmacy tells her it's ready and she goes and picks it up. Denies having a 'stockpile.'  I do not want her taking 3 mg of  Xanax at night either.  She can take 1 within 6 hours of bedtime and then take 1 at bedtime with the Ambien, and then she will have 1 4 during the day if needed.  Of course it is best if we can get her off Xanax completely and treat the anxiety some other way.  States she is not ready to do that she is under too much stress at this point.  She was told that if she continues to get the Xanax early or takes it inappropriately then I will wean her off of it.  She understood, acted irritated.  I called the pharmacy, [facility] [address] spoke with pharmacist [employer].  She told me that she did not pick up several of the prescriptions at their pharmacy.  We cannot figure out where else she would have gotten them because on the controlled substance registry [facility] she gets controlled substances.  From now on, she will only be able to get the Ambien and Xanax every 30 days.  Because of the confusion after the last visit, concerning gabapentin versus Topamax, she only took the Topamax for a few  days and then stopped, changing to gabapentin.  It does not sound like the gabapentin is working at all for restless legs or anxiety, or sleep so we will stop it and restart the Topamax. Discontinue gabapentin. Start Topamax 25 mg, 1 p.o. twice daily. Continue Xanax 1 mg, 1 p.o. 3 times daily as needed. Continue Lexapro 10 mg, 2 p.o. daily. Continue hydroxyzine 25 mg 1 p.o. 3 times daily as needed. Continue Ambien 10 mg, 1 p.o. nightly as needed sleep. Return in approximately 6 weeks.  [name], PA-C

## 2021-04-18 NOTE — Telephone Encounter (Signed)
Patient called in to inform TH that she spoke with [facility] pharmacy [facility] and she doesn't have any refills for Ambien. Ph: [phone]. Appt 11/9.

## 2021-04-18 NOTE — Telephone Encounter (Signed)
What is her new pharmacy?  She had an appointment earlier today and told me she still uses [facility] [address] in [place].  I will send in the Ambien refill when it is appropriate.  She won't be able to get it until 05/07/2021. Thanks.

## 2021-04-18 NOTE — Telephone Encounter (Signed)
Pt stated she did get rx on 9/18 but you discussed sending a refill to her new pharmacy to be ready when she needs it

## 2021-04-19 NOTE — Telephone Encounter (Signed)
Thanks

## 2021-04-19 NOTE — Telephone Encounter (Signed)
[facility] the [facility] she is now using [facility] on file

## 2021-04-30 ENCOUNTER — Other Ambulatory Visit: Payer: Self-pay | Admitting: Physician Assistant

## 2021-05-30 ENCOUNTER — Ambulatory Visit: Payer: BC Managed Care – PPO | Admitting: Physician Assistant

## 2021-07-02 ENCOUNTER — Ambulatory Visit: Payer: BC Managed Care – PPO | Admitting: Cardiology

## 2021-07-08 IMAGING — DX DG CHEST 2V
2 series · 2 of 2 positions shown · non-contrast
Comparison: None.

CLINICAL DATA: Cough and shortness of breath post COVID

EXAM:
CHEST - 2 VIEW

[chest pa]
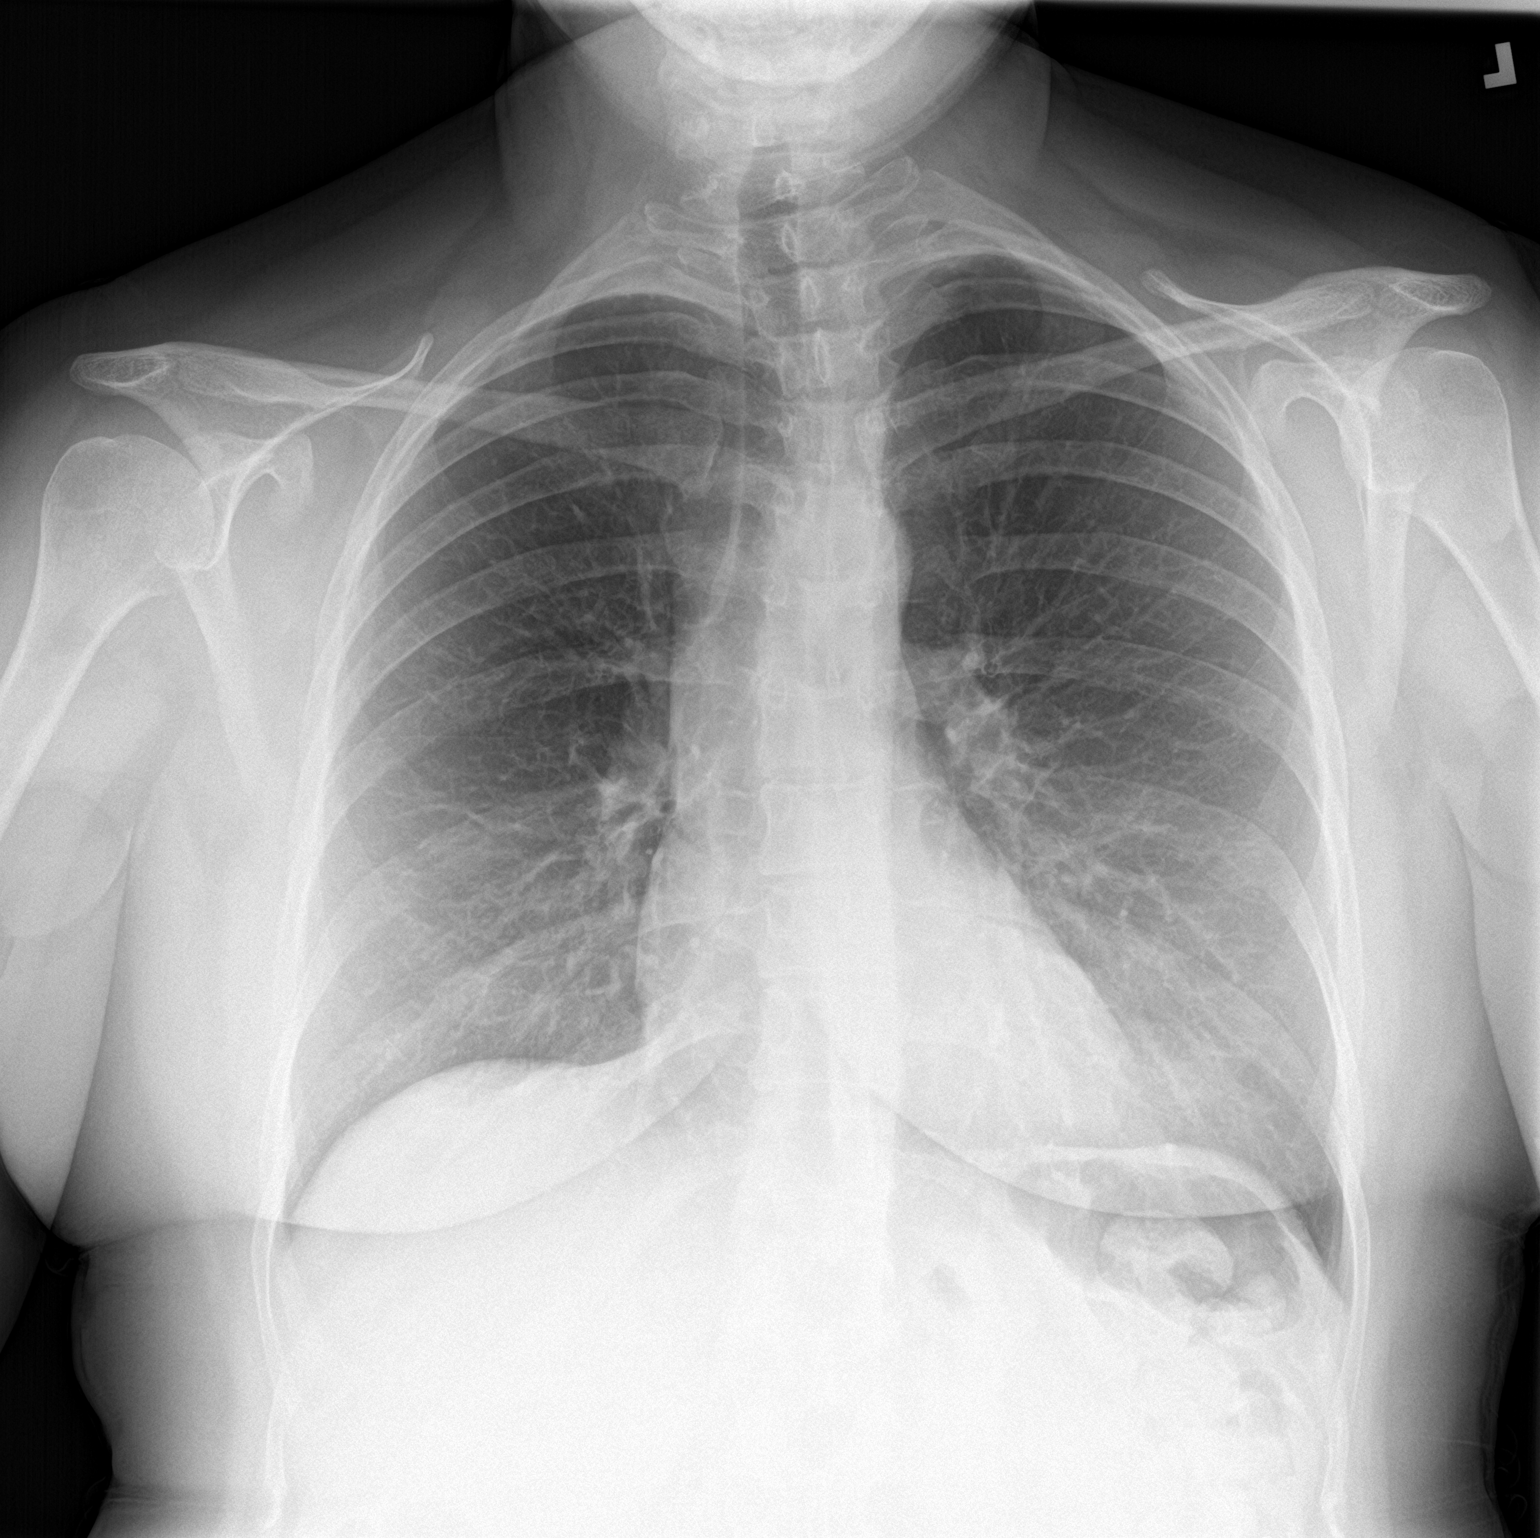

[chest lat]
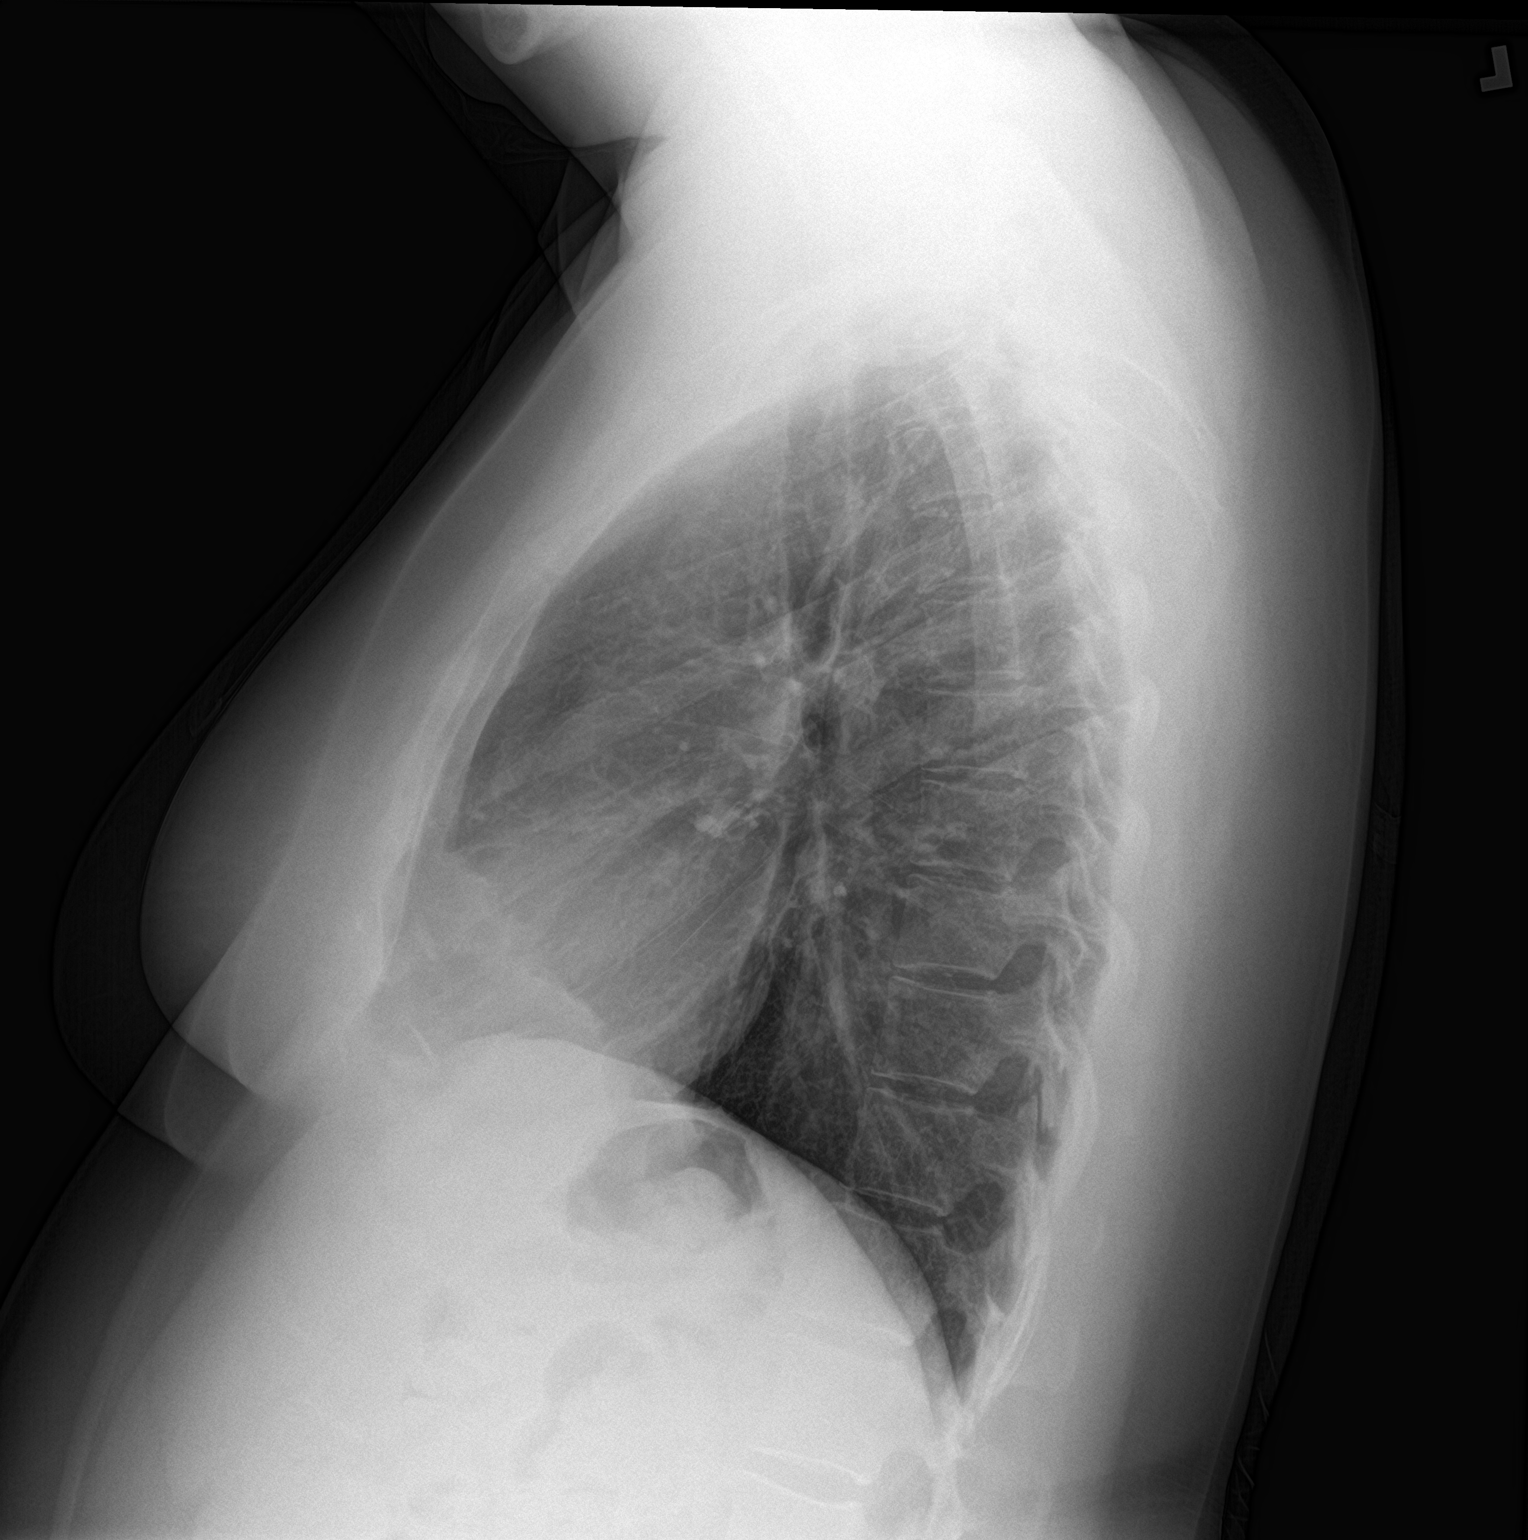

[2 of 2 positions shown; findings below may reference images not displayed]

FINDINGS: The heart size and mediastinal contours are within normal limits.
Both lungs are clear. The visualized skeletal structures are
unremarkable.
IMPRESSION: No active cardiopulmonary disease.

## 2021-07-12 ENCOUNTER — Ambulatory Visit: Payer: BC Managed Care – PPO | Admitting: Family Medicine

## 2021-09-13 ENCOUNTER — Ambulatory Visit: Payer: BC Managed Care – PPO | Admitting: Family Medicine

## 2021-09-19 HISTORY — PX: FOOT SURGERY: SHX648

## 2022-06-19 NOTE — Progress Notes (Unsigned)
[facility] [address] [place]. [place] [address] [phone]       OFFICE FOLLOW UP NOTE  Ms. Isabella Martin Date of Birth:  [birth year] Medical Record Number:  [id number]    Primary neurologist: Dr. Frances Furbish Reason for visit: Chronic migraine headaches    SUBJECTIVE:   CHIEF COMPLAINT:  No chief complaint on file.   HPI:   Update 06/20/2022 JM: Patient returns for migraine follow-up after prior visit with Dr. Frances Furbish over 1 year ago.      Consult visit 03/28/2021 Dr. Frances Furbish: Isabella Martin is a 40 year old right-handed woman with an underlying medical history of anxiety depression, reflux disease, IBS, gastritis, Hx of Covid, and obesity, who reports recurrent headaches especially since she was diagnosed with COVID in February 2022.  She reports infrequent migraine-like headaches in the past, as long as 15 years ago, sometimes triggered but usually self-limited.  In the past several months she has had recurrent headaches which are throbbing, typically bifrontal and associated with nausea, typically no vomiting and light sensitivity.  She had an eye examination about a year ago.  She has bifocal prescription eyeglasses.  She has a family history of migraines affecting her mother and maternal aunt.  She has not had a brain scan.  She would like to avoid doing any additional testing due to cost.  She has not noticed any triggers at this time.  She tries to hydrate well with water, she does drink caffeine in the form of sweet tea, about 4 glasses/day.  She is smokes about a half a pack per day.  She is currently not working actively on smoking cessation.  She does not drink any alcohol.  She lives with her husband and 62 year old son.  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 She denies any sudden onset of one-sided weakness or numbness or tingling or droopy face or slurring of speech.  She typically does not have any visual symptoms. Of note, she is followed by  psychiatry, she typically sees Isabella Martin, [place].  She has had some medication changes in the recent past.  She is currently on Lexapro 20 mg daily.  She was on Topamax 25 mg twice daily and this was stopped about 2 weeks ago and she was started on gabapentin which is currently 600 mg in the evening.  She has been on Lamictal 50 mg daily but this was stopped.  She was started several months ago on Lopressor 25 mg twice daily for tachycardia.  She has also seen cardiology.  I reviewed your office records from 12/23/2020, she saw Marcheta Grammes, NP at the time.  She has had a recent home sleep test through cardiology on 03/03/2021, which showed an AHI of 3.8, O2 nadir 83%.  She has been using nausea medication.  She reports a history of irritable bowel syndrome and gastritis.  She has a headache about once a week at this time and takes Tylenol.  She cannot take nonsteroidals.  Tylenol does not typically help very much.       ROS:   14 system review of systems performed and negative with exception of ***  PMH:  Past Medical History:  Diagnosis Date   Anxiety    COVID-19 long hauler    Depression    Fatigue    GERD (gastroesophageal reflux disease)    IBS (irritable bowel syndrome)    Insomnia    Migraine    PTSD (post-traumatic stress disorder)  Restless leg syndrome     PSH:  Past Surgical History:  Procedure Laterality Date   CHOLECYSTECTOMY, LAPAROSCOPIC     Double Proofreader      Social History:  Social History   Socioeconomic History   Marital status: Married    Spouse name: Not on file   Number of children: Not on file   Years of education: Not on file   Highest education level: Not on file  Occupational History   Not on file  Tobacco Use   Smoking status: Every Day    Packs/day: 0.50    Types: Cigarettes   Smokeless tobacco: Never  Substance and Sexual Activity   Alcohol use: Not Currently    Comment: 1 drink a month   Drug use: No   Sexual activity: Yes   Other Topics Concern   Not on file  Social History Narrative   Lives home with husband and 18yo son.  Veterinary surgeon, works Regulatory affairs officer.     Social Determinants of Health   Financial Resource Strain: Not on file  Food Insecurity: Not on file  Transportation Needs: Not on file  Physical Activity: Not on file  Stress: Not on file  Social Connections: Not on file  Intimate Partner Violence: Not on file    Family History:  Family History  Problem Relation Age of Onset   Healthy Mother    Depression Mother    Cancer Father     Medications:   Current Outpatient Medications on File Prior to Visit  Medication Sig Dispense Refill   ALPRAZolam (XANAX) 1 MG tablet TAKE 1 TABLET(1 MG) BY MOUTH THREE TIMES DAILY AS NEEDED FOR ANXIETY 90 tablet 0   escitalopram (LEXAPRO) 10 MG tablet Take 2 tablets (20 mg total) by mouth daily. 180 tablet 1   Etonogestrel (NEXPLANON [place]) Inject into the skin.     hydrOXYzine (ATARAX/VISTARIL) 25 MG tablet Take 25 mg by mouth 3 (three) times daily. Takes rarely     ipratropium-albuterol (DUONEB) 0.5-2.5 (3) MG/3ML SOLN Take 3 mLs by nebulization every 4 (four) hours as needed. 360 mL 0   metoprolol tartrate (LOPRESSOR) 25 MG tablet Take 25 mg by mouth 2 (two) times daily.     ondansetron (ZOFRAN) 4 MG tablet Take 1 tablet (4 mg total) by mouth every 8 (eight) hours as needed for nausea or vomiting. 20 tablet 0   pantoprazole (PROTONIX) 40 MG tablet Take by mouth.     topiramate (TOPAMAX) 25 MG tablet Take 1 tablet (25 mg total) by mouth 2 (two) times daily. 60 tablet 1   Ubrogepant (UBRELVY) 50 MG TABS Take 50 mg by mouth as needed (may repeat once in 2 hours. no more than 2 pills in 24 h.). 10 tablet 3   Vitamin D, Ergocalciferol, (DRISDOL) 1.25 MG (50000 UNIT) CAPS capsule Take 1 capsule (50,000 Units total) by mouth every 7 (seven) days. 12 capsule 0   zolpidem (AMBIEN) 10 MG tablet TAKE 1 TABLET(10 MG) BY MOUTH AT BEDTIME AS NEEDED FOR SLEEP 30  tablet 2   No current facility-administered medications on file prior to visit.    Allergies:   Allergies  Allergen Reactions   Elavil [Amitriptyline] Other (See Comments)     obstipation requiring  2 ER visit, 2 enemas, MagCitrate, and entire bottle of Miralax in Gatorade.   Nitrofurantoin    Sulfonamide Derivatives    Wellbutrin [Bupropion] Other (See Comments)    Agitation and irritability  OBJECTIVE:  Physical Exam  There were no vitals filed for this visit. There is no height or weight on file to calculate BMI. No results found.   General: well developed, well nourished, seated, in no evident distress Head: head normocephalic and atraumatic.   Neck: supple with no carotid or supraclavicular bruits Cardiovascular: regular rate and rhythm, no murmurs Musculoskeletal: no deformity Skin:  no rash/petichiae Vascular:  Normal pulses all extremities   Neurologic Exam Mental Status: Awake and fully alert. Oriented to place and time. Recent and remote memory intact. Attention span, concentration and fund of knowledge appropriate. Mood and affect appropriate.  Cranial Nerves: Pupils equal, briskly reactive to light. Extraocular movements full without nystagmus. Visual fields full to confrontation. Hearing intact. Facial sensation intact. Face, tongue, palate moves normally and symmetrically.  Motor: Normal bulk and tone. Normal strength in all tested extremity muscles Sensory.: intact to touch , pinprick , position and vibratory sensation.  Coordination: Rapid alternating movements normal in all extremities. Finger-to-nose and heel-to-shin performed accurately bilaterally. Gait and Station: Arises from chair without difficulty. Stance is normal. Gait demonstrates normal stride length and balance without use of AD. Tandem walk and heel toe without difficulty.  Reflexes: 1+ and symmetric. Toes downgoing.         ASSESSMENT/PLAN: Kanecia Chick is a 41 y.o. year old  female    1.  Chronic migraine headaches   -Tried/failed: Topiramate, gabapentin, beta-blocker, venlafaxine, amitriptyline, lamotrigine, Ubrelvy         Follow up in *** or call earlier if needed   CC:  PCP: Isabella Lush, PA-C    I spent *** minutes of face-to-face and non-face-to-face time with patient.  This included previsit chart review, lab review, study review, order entry, electronic health record documentation, patient education regarding ***   Frann Rider, AGNP-BC  [facility] [address] [place], [place] [address]  Phone [phone] Fax [phone] Note: This document was prepared with digital dictation and possible smart phrase technology. Any transcriptional errors that result from this process are unintentional.

## 2022-06-20 ENCOUNTER — Encounter: Payer: Self-pay | Admitting: Adult Health

## 2022-06-20 ENCOUNTER — Ambulatory Visit (INDEPENDENT_AMBULATORY_CARE_PROVIDER_SITE_OTHER): Payer: BC Managed Care – PPO | Admitting: Adult Health

## 2022-06-20 VITALS — BP 133/85 | HR 105 | Ht 64.0 in | Wt 216.0 lb

## 2022-06-20 DIAGNOSIS — G43009 Migraine without aura, not intractable, without status migrainosus: Secondary | ICD-10-CM

## 2022-06-20 MED ORDER — RIZATRIPTAN BENZOATE 10 MG PO TBDP: 10.0000 mg | ORAL_TABLET | ORAL | 11 refills | Status: DC | PRN

## 2022-06-20 MED ORDER — PROMETHAZINE HCL 12.5 MG PO TABS: 12.5000 mg | ORAL_TABLET | Freq: Four times a day (QID) | ORAL | 5 refills | Status: DC | PRN

## 2022-06-20 MED ORDER — BACLOFEN 10 MG PO TABS: 10.0000 mg | ORAL_TABLET | Freq: Every day | ORAL | 5 refills | Status: DC | PRN

## 2022-06-20 MED ORDER — NURTEC 75 MG PO TBDP: 75.0000 mg | ORAL_TABLET | ORAL | 5 refills | Status: DC

## 2022-06-20 NOTE — Patient Instructions (Addendum)
Start Nurtec 75mg  tablet every other day to stop migraine headaches  Please let me know after a month if headaches persist  We may need to consider trialing an injectable monthly medication to to try to stop these headaches if they persist  Start rizatriptan to take at onset of migraine, can repeat after 2 hours if needed  Continue baclofen as needed for acute migraines and use of Phenergan as needed for nausea associated with migraine     Follow up in 3 months or call earlier if needed

## 2022-07-25 ENCOUNTER — Telehealth: Payer: Self-pay

## 2022-07-25 ENCOUNTER — Encounter: Payer: Self-pay | Admitting: Adult Health

## 2022-07-25 MED ORDER — EMGALITY 120 MG/ML ~~LOC~~ SOAJ: 120.0000 mg | SUBCUTANEOUS | 11 refills | Status: DC

## 2022-07-25 MED ORDER — METHYLPREDNISOLONE 4 MG PO TBPK: ORAL_TABLET | ORAL | 0 refills | Status: DC

## 2022-07-25 NOTE — Telephone Encounter (Signed)
Do you think she would be a candidate for botox or should she trial different medication first?

## 2022-07-25 NOTE — Telephone Encounter (Signed)
Attempted to submit PA via [facility] Key: [id number]  Message from Plan Your PA has been resolved, no additional PA is required. For further inquiries please contact the number on the back of the member prescription card. (Message [phone])  Contact pt insurance, spoke to [place]. Was on the line for 45 mins trying to initiate PA. She started her own  KEY; [place]. I attempted to to submit that PA and got the same message as before. Will attempt contact insurance back again Monday.

## 2022-07-25 NOTE — Telephone Encounter (Signed)
We can see if insurance will approve botox but may need to fail CRGP's first. If she is interested in botox we can place order to see if it will go through or we can first trial CRGP. Let me know which she would like to do. Thank you.

## 2022-07-25 NOTE — Telephone Encounter (Signed)
Which CGRP do you recommend for her? [place].

## 2022-07-25 NOTE — Addendum Note (Signed)
Addended by: Frann Rider L on: 07/25/2022 04:16 PM   Modules accepted: Orders

## 2022-07-25 NOTE — Telephone Encounter (Signed)
Pt called stating that the injection has a high cost and would like to know if something else can be called in for her. Please advise.

## 2022-07-29 ENCOUNTER — Telehealth: Payer: Self-pay

## 2022-07-29 MED ORDER — AJOVY 225 MG/1.5ML ~~LOC~~ SOAJ: 225.0000 mg | SUBCUTANEOUS | 11 refills | Status: DC

## 2022-07-29 NOTE — Telephone Encounter (Signed)
We can initiate Botox process but unsure if her cost will be $300/50 units, we use 155 units per procedure so it will be much more than just $300. If she has Pharmacist, community, she can submit claims to Botox savings program which can help cover cost up to a certain amount per calendar year.  If she wishes to proceed with botox, can start process. Thank you.

## 2022-07-29 NOTE — Telephone Encounter (Signed)
Have not submitted PA yet

## 2022-07-29 NOTE — Telephone Encounter (Signed)
Ok for patient to try botox as requested ?

## 2022-07-29 NOTE — Addendum Note (Signed)
Addended by: Frann Rider L on: 07/29/2022 09:57 AM   Modules accepted: Orders

## 2022-07-29 NOTE — Telephone Encounter (Signed)
Will try Ajovy to see if this is a more reasonable/affordable option.. new order placed. Thank you.

## 2022-07-29 NOTE — Telephone Encounter (Signed)
Contacted insurance back, spoke to [place], she stated PA was submitted but its not required. She stated her plan wont allow a tiering exception either and pt would have to pay that $400 co pay. Is there another CGRP you would like to try ?

## 2022-07-29 NOTE — Telephone Encounter (Signed)
Attempted to submitted PA via [facility]  Your PA request cannot be processed electronically. Please contact the number from the back of the members prescription card for further assistance

## 2022-10-10 ENCOUNTER — Encounter: Payer: Self-pay | Admitting: Adult Health

## 2022-10-10 ENCOUNTER — Ambulatory Visit (INDEPENDENT_AMBULATORY_CARE_PROVIDER_SITE_OTHER): Payer: BC Managed Care – PPO | Admitting: Adult Health

## 2022-10-10 VITALS — BP 154/94 | HR 125 | Ht 64.0 in | Wt 219.4 lb

## 2022-10-10 DIAGNOSIS — G43009 Migraine without aura, not intractable, without status migrainosus: Secondary | ICD-10-CM | POA: Diagnosis not present

## 2022-10-10 MED ORDER — BACLOFEN 10 MG PO TABS: 10.0000 mg | ORAL_TABLET | Freq: Every day | ORAL | 3 refills | Status: DC | PRN

## 2022-10-10 MED ORDER — RIZATRIPTAN BENZOATE 10 MG PO TBDP: 10.0000 mg | ORAL_TABLET | ORAL | 11 refills | Status: DC | PRN

## 2022-10-10 MED ORDER — PROMETHAZINE HCL 12.5 MG PO TABS: 12.5000 mg | ORAL_TABLET | Freq: Four times a day (QID) | ORAL | 11 refills | Status: DC | PRN

## 2022-10-10 NOTE — Patient Instructions (Addendum)
Your Plan:  Continue current treatment plan - please call with any worsening migraine headaches  Start rizatriptan as needed at onset of migraine headaches, can repeat x1 after 2 hours if needed - please let me know if this does not work well for you to discuss other options     Follow up in 1 year or call earlier if needed     Thank you for coming to see [name] at [facility]. I hope we have been able to provide you high quality care today.  You may receive a patient satisfaction survey over the next few weeks. We would appreciate your feedback and comments so that we may continue to improve ourselves and the health of our patients.

## 2022-10-10 NOTE — Progress Notes (Signed)
[facility] [address] [place]. [place] [address] [phone]       OFFICE FOLLOW UP NOTE  Ms. Isabella Martin Date of Birth:  1981/03/19 Medical Record Number:  [id number]    Primary neurologist: Dr. Rexene Alberts Reason for visit: Chronic migraine headaches    SUBJECTIVE:   CHIEF COMPLAINT:  Chief Complaint  Patient presents with   Follow-up    Patient in room #3 and alone. Patient states she migraines has calm down but she has a headaches today.    HPI:   Update 10/10/2022 JM: Patient returns for migraine follow-up.  At prior visit, she was started on Nurtec every other day for preventative but called office after 1 month due to no benefit and persistent migraines.  Recommend starting Emgality but was denied by insurance therefore switched to Ajovy.  Reports improvement of migraines on Ajovy, currently about 5 per month (prior 15/month). Will use baclofen at onset of migraine and Phenergan as needed for nausea with benefit.  Was prescribed rizatriptan at prior visit but she was not aware of this new prescription so it has not yet been tried. She is currently satisfied with preventative therapy and frequency of migraines at this time.      History provided for reference purposes only Update 06/20/2022 JM: Patient returns for migraine follow-up after prior visit with Dr. Rexene Alberts over 1 year ago.  She reports increase in typical migraine headaches currently experiencing about 15 migraine days per month over the past couple of months. PCP gave her baclofen to take at onset which helped with tension, denies benefit with Roselyn Meier, PCP started sumatriptan which works occasionally, migraines typically resolve after about 4-6 hours.  Denies overuse of OTC medication.  She is currently on metoprolol 25 mg twice daily for heart rate control.  Consult visit 03/28/2021 Dr. Rexene Alberts: Ms. Isabella Martin is a 42 year old right-handed woman with an underlying medical history of anxiety  depression, reflux disease, IBS, gastritis, Hx of Covid, and obesity, who reports recurrent headaches especially since she was diagnosed with COVID in February 2022.  She reports infrequent migraine-like headaches in the past, as long as 15 years ago, sometimes triggered but usually self-limited.  In the past several months she has had recurrent headaches which are throbbing, typically bifrontal and associated with nausea, typically no vomiting and light sensitivity.  She had an eye examination about a year ago.  She has bifocal prescription eyeglasses.  She has a family history of migraines affecting her mother and maternal aunt.  She has not had a brain scan.  She would like to avoid doing any additional testing due to cost.  She has not noticed any triggers at this time.  She tries to hydrate well with water, she does drink caffeine in the form of sweet tea, about 4 glasses/day.  She is smokes about a half a pack per day.  She is currently not working actively on smoking cessation.  She does not drink any alcohol.  She lives with her husband and 85 year old son.  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 She denies any sudden onset of one-sided weakness or numbness or tingling or droopy face or slurring of speech.  She typically does not have any visual symptoms. Of note, she is followed by psychiatry, she typically sees Donnal Moat, [place].  She has had some medication changes in the recent past.  She is currently on Lexapro 20 mg daily.  She was on Topamax 25 mg  twice daily and this was stopped about 2 weeks ago and she was started on gabapentin which is currently 600 mg in the evening.  She has been on Lamictal 50 mg daily but this was stopped.  She was started several months ago on Lopressor 25 mg twice daily for tachycardia.  She has also seen cardiology.  I reviewed your office records from 12/23/2020, she saw Benny Lennert, NP at the time.  She has had a recent home sleep test through cardiology  on 03/03/2021, which showed an AHI of 3.8, O2 nadir 83%.  She has been using nausea medication.  She reports a history of irritable bowel syndrome and gastritis.  She has a headache about once a week at this time and takes Tylenol.  She cannot take nonsteroidals.  Tylenol does not typically help very much.       ROS:   14 system review of systems performed and negative with exception of those listed in HPI  PMH:  Past Medical History:  Diagnosis Date   Anxiety    COVID-19 long hauler    Depression    Fatigue    GERD (gastroesophageal reflux disease)    IBS (irritable bowel syndrome)    Insomnia    Migraine    PTSD (post-traumatic stress disorder)    Restless leg syndrome     PSH:  Past Surgical History:  Procedure Laterality Date   CHOLECYSTECTOMY, LAPAROSCOPIC     Double Syicter Plasty     FOOT SURGERY Right 09/2021    Social History:  Social History   Socioeconomic History   Marital status: Married    Spouse name: Not on file   Number of children: Not on file   Years of education: Not on file   Highest education level: Not on file  Occupational History   Not on file  Tobacco Use   Smoking status: Every Day    Packs/day: .5    Types: Cigarettes   Smokeless tobacco: Never  Substance and Sexual Activity   Alcohol use: Not Currently    Comment: 1 drink a month   Drug use: No   Sexual activity: Yes  Other Topics Concern   Not on file  Social History Narrative   Lives home with husband and 76yo son.  Veterinary surgeon, works Regulatory affairs officer.     Social Determinants of Health   Financial Resource Strain: Not on file  Food Insecurity: Not on file  Transportation Needs: Not on file  Physical Activity: Not on file  Stress: Not on file  Social Connections: Not on file  Intimate Partner Violence: Not on file    Family History:  Family History  Problem Relation Age of Onset   Healthy Mother    Depression Mother    Cancer Father     Medications:    Current Outpatient Medications on File Prior to Visit  Medication Sig Dispense Refill   ALPRAZolam (XANAX) 1 MG tablet TAKE 1 TABLET(1 MG) BY MOUTH THREE TIMES DAILY AS NEEDED FOR ANXIETY 90 tablet 0   ARIPiprazole (ABILIFY) 10 MG tablet Take 10 mg by mouth daily.     baclofen (LIORESAL) 10 MG tablet Take 1 tablet (10 mg total) by mouth daily as needed for muscle spasms (migraine). 30 each 5   escitalopram (LEXAPRO) 10 MG tablet Take 2 tablets (20 mg total) by mouth daily. 180 tablet 1   Etonogestrel (NEXPLANON [place]) Inject into the skin.     Fremanezumab-vfrm (AJOVY) 225 MG/1.5ML SOAJ Inject  225 mg into the skin every 30 (thirty) days. 1.68 mL 11   hydrOXYzine (ATARAX/VISTARIL) 25 MG tablet Take 25 mg by mouth 3 (three) times daily. Takes rarely     ipratropium-albuterol (DUONEB) 0.5-2.5 (3) MG/3ML SOLN Take 3 mLs by nebulization every 4 (four) hours as needed. 360 mL 0   methylPREDNISolone (MEDROL DOSEPAK) 4 MG TBPK tablet Take as prescribed 1 each 0   metoprolol tartrate (LOPRESSOR) 25 MG tablet Take 25 mg by mouth 2 (two) times daily.     promethazine (PHENERGAN) 12.5 MG tablet Take 1 tablet (12.5 mg total) by mouth every 6 (six) hours as needed for nausea or vomiting (migraine). 15 tablet 5   Rimegepant Sulfate (NURTEC) 75 MG TBDP Take 75 mg by mouth every other day. 15 tablet 5   rizatriptan (MAXALT-MLT) 10 MG disintegrating tablet Take 1 tablet (10 mg total) by mouth as needed for migraine. May repeat in 2 hours if needed 9 tablet 11   Vitamin D, Ergocalciferol, (DRISDOL) 1.25 MG (50000 UNIT) CAPS capsule Take 1 capsule (50,000 Units total) by mouth every 7 (seven) days. 12 capsule 0   zolpidem (AMBIEN) 10 MG tablet TAKE 1 TABLET(10 MG) BY MOUTH AT BEDTIME AS NEEDED FOR SLEEP 30 tablet 2   No current facility-administered medications on file prior to visit.    Allergies:   Allergies  Allergen Reactions   Elavil [Amitriptyline] Other (See Comments)     obstipation requiring  2 ER  visit, 2 enemas, MagCitrate, and entire bottle of Miralax in Gatorade.   Nitrofurantoin    Sulfonamide Derivatives    Wellbutrin [Bupropion] Other (See Comments)    Agitation and irritability      OBJECTIVE:  Physical Exam  Vitals:   10/10/22 1327  BP: (!) 154/94  Pulse: (!) 125  Weight: 219 lb 6.4 oz (99.5 kg)  Height: 5\' 4"  (1.626 m)    Body mass index is 37.66 kg/m. No results found.   General: well developed, well nourished, pleasant middle-age Caucasian female, seated, in no evident distress Head: head normocephalic and atraumatic.   Neck: supple with no carotid or supraclavicular bruits Cardiovascular: regular rate and rhythm, no murmurs Musculoskeletal: no deformity Skin:  no rash/petichiae Vascular:  Normal pulses all extremities   Neurologic Exam Mental Status: Awake and fully alert. Oriented to place and time. Recent and remote memory intact. Attention span, concentration and fund of knowledge appropriate. Mood and affect appropriate.  Cranial Nerves: Pupils equal, briskly reactive to light. Extraocular movements full without nystagmus. Visual fields full to confrontation. Hearing intact. Facial sensation intact. Face, tongue, palate moves normally and symmetrically.  Motor: Normal bulk and tone. Normal strength in all tested extremity muscles Sensory.: intact to touch , pinprick , position and vibratory sensation.  Coordination: Rapid alternating movements normal in all extremities. Finger-to-nose and heel-to-shin performed accurately bilaterally. Gait and Station: Arises from chair without difficulty. Stance is normal. Gait demonstrates normal stride length and balance without use of AD. Tandem walk and heel toe without difficulty.  Reflexes: 1+ and symmetric. Toes downgoing.         ASSESSMENT/PLAN: Dametria Gressman is a 42 y.o. year old female    1.  Chronic migraine headaches  -Prevention: Continue Ajovy monthly injection  -Rescue: Start  rizatriptan as needed for migraine, can repeat x1 after 2 hrs if needed. Continue use of baclofen and Phenergan as needed  -Tried/failed: Topiramate, gabapentin, metoprolol, venlafaxine, amitriptyline, lamotrigine, Ubrelvy, Zofran, sumatriptan, Nurtec    Follow up in  1 year or call earlier if needed   CC:  PCP: Sue Lush, PA-C    I spent 24 minutes of face-to-face and non-face-to-face time with patient.  This included previsit chart review, lab review, study review, order entry, electronic health record documentation, patient education regarding above diagnoses and treatment plan and answered all the questions to patient's satisfaction   Frann Rider, [facility]  [facility] [address] [place], [place] [address]  Phone [phone] Fax [phone] Note: This document was prepared with digital dictation and possible smart phrase technology. Any transcriptional errors that result from this process are unintentional.

## 2023-07-17 ENCOUNTER — Encounter: Payer: Self-pay | Admitting: Adult Health

## 2023-07-17 MED ORDER — BACLOFEN 10 MG PO TABS: 10.0000 mg | ORAL_TABLET | Freq: Every day | ORAL | 0 refills | Status: AC | PRN

## 2023-07-17 MED ORDER — PROMETHAZINE HCL 12.5 MG PO TABS: 12.5000 mg | ORAL_TABLET | Freq: Four times a day (QID) | ORAL | 0 refills | Status: AC | PRN

## 2023-07-17 MED ORDER — AJOVY 225 MG/1.5ML ~~LOC~~ SOAJ: 225.0000 mg | SUBCUTANEOUS | 3 refills | Status: AC

## 2023-08-04 ENCOUNTER — Telehealth: Payer: Self-pay | Admitting: Adult Health

## 2023-08-04 NOTE — Telephone Encounter (Signed)
 Pt states she currently pays $200 for Fremanezumab-vfrm (AJOVY) 225 MG/1.5ML SOAJ and was told per her pharmacy that if it was sent as a Tier 1 the cost would go down. Requesting call back to discuss

## 2023-08-06 ENCOUNTER — Telehealth: Payer: Self-pay

## 2023-08-06 ENCOUNTER — Encounter: Payer: Self-pay | Admitting: Adult Health

## 2023-08-06 ENCOUNTER — Other Ambulatory Visit (HOSPITAL_COMMUNITY): Payer: Self-pay

## 2023-08-06 NOTE — Telephone Encounter (Signed)
 Called the patient and she states she has not received any new card information. She read to me the same information that we have on file. I have reached out to the [facility] on the back of the card and spoke with a representative. The name on the card is Isabella Martin and when providing the member ID Would leave off the letters and just provide the numbers so member ID: [id number]  They ran the script through and it mentioned it was a paid claim but the price may be partly due to her deductible needing to be met.  Advised that I was told by the patient she may could get a tier exception. They will fax that form over and we can attempt that and submit.

## 2023-08-06 NOTE — Telephone Encounter (Signed)
 Please have PT upload her pharmacy benefits insurance card please-the last time insurance was scanned in the chart was 03/28/2021. I tried to run a test claim with that insurance from 2022 and got a rejection.    I also could not pull any insurance with our eligibility checker. Thanks.

## 2023-08-07 ENCOUNTER — Encounter: Payer: Self-pay | Admitting: Adult Health

## 2023-08-07 MED ORDER — AJOVY 225 MG/1.5ML ~~LOC~~ SOAJ: 225.0000 mg | Freq: Once | SUBCUTANEOUS | 0 refills | Status: DC

## 2023-08-07 MED ORDER — AJOVY 225 MG/1.5ML ~~LOC~~ SOAJ: 225.0000 mg | Freq: Once | SUBCUTANEOUS | Status: AC

## 2023-08-07 NOTE — Telephone Encounter (Signed)
I never received the [place] tier exception form to be completed for the pt. -Called [facility] number [phone] Spoke with a representative Malita who states that the $729.87 They are getting is towards the pt's deductible. A tier exception is not going to help lower that cost.  I will see If the patient would like to try a savings card

## 2023-08-07 NOTE — Addendum Note (Signed)
Addended by: Judi Cong on: 08/07/2023 02:54 PM   Modules accepted: Orders

## 2023-08-22 NOTE — Telephone Encounter (Signed)
Isabella Martin from [place] states they have only received 2 paystubs, re: needed proof of Income, thye need 3 a call back can be made to [phone] fax to [phone]

## 2023-09-03 NOTE — Telephone Encounter (Signed)
[employer] states pt needs to send in her application, representative asked for [place], [place].  They can be called at [phone] fax#[phone]

## 2023-09-18 ENCOUNTER — Telehealth: Payer: Self-pay | Admitting: Adult Health

## 2023-09-18 NOTE — Telephone Encounter (Signed)
 Avis from [employer] is asking for pages 1-3 of pt's application, he is asking it be faxed to [phone] Fremanezumab-vfrm (AJOVY) 225 MG/1.5ML SOAJ he can called back at [phone] if there are questions

## 2023-09-18 NOTE — Telephone Encounter (Signed)
 Received another fax stating [employer] is missing the complete application.   Re-faxed to [phone] received confirmation

## 2023-10-09 ENCOUNTER — Ambulatory Visit: Payer: BC Managed Care – PPO | Admitting: Adult Health

## 2023-10-29 ENCOUNTER — Other Ambulatory Visit: Payer: Self-pay | Admitting: Adult Health

## 2023-12-31 ENCOUNTER — Ambulatory Visit: Admitting: Adult Health

## 2024-03-29 ENCOUNTER — Telehealth: Payer: Self-pay | Admitting: Adult Health

## 2024-03-29 NOTE — Telephone Encounter (Signed)
 Spoke w/Pt regarding phone message and MRI. Informed Pt in review of the notes from her ED visit they advised she follow up with PCP to discuss possibility of MRI. Pt voiced understanding and thanks for the call.

## 2024-03-29 NOTE — Telephone Encounter (Signed)
 Pt called to request MD TO place order for MRI. Pt was recently in hospital and was to follow up with MD to see if she can get another MRI Done
